# Patient Record
Sex: Male | Born: 1960 | Race: White | Hispanic: No | Marital: Married | Smoking: Former smoker
Health system: Southern US, Community
[De-identification: ages and names within clinical notes are randomized; demographics above are authoritative.]

## PROBLEM LIST (undated history)

## (undated) DIAGNOSIS — I10 Essential (primary) hypertension: Secondary | ICD-10-CM

## (undated) DIAGNOSIS — F329 Major depressive disorder, single episode, unspecified: Secondary | ICD-10-CM

## (undated) DIAGNOSIS — I714 Abdominal aortic aneurysm, without rupture, unspecified: Secondary | ICD-10-CM

## (undated) DIAGNOSIS — F4310 Post-traumatic stress disorder, unspecified: Secondary | ICD-10-CM

## (undated) DIAGNOSIS — M1990 Unspecified osteoarthritis, unspecified site: Secondary | ICD-10-CM

## (undated) DIAGNOSIS — F419 Anxiety disorder, unspecified: Secondary | ICD-10-CM

## (undated) DIAGNOSIS — I2510 Atherosclerotic heart disease of native coronary artery without angina pectoris: Secondary | ICD-10-CM

## (undated) DIAGNOSIS — F32A Depression, unspecified: Secondary | ICD-10-CM

## (undated) HISTORY — PX: APPENDECTOMY: SHX54

## (undated) HISTORY — PX: HERNIA REPAIR: SHX51

---

## 2008-10-19 HISTORY — PX: WISDOM TOOTH EXTRACTION: SHX21

## 2013-05-16 ENCOUNTER — Emergency Department (HOSPITAL_COMMUNITY): Payer: Worker's Compensation

## 2013-05-16 ENCOUNTER — Emergency Department (HOSPITAL_COMMUNITY)
Admission: EM | Admit: 2013-05-16 | Discharge: 2013-05-17 | Disposition: A | Discharge status: Home / Self Care | Payer: Worker's Compensation | Attending: Emergency Medicine | Admitting: Emergency Medicine

## 2013-05-16 ENCOUNTER — Encounter (HOSPITAL_COMMUNITY): Payer: Self-pay | Admitting: Unknown Physician Specialty

## 2013-05-16 DIAGNOSIS — S42002A Fracture of unspecified part of left clavicle, initial encounter for closed fracture: Secondary | ICD-10-CM

## 2013-05-16 DIAGNOSIS — IMO0002 Reserved for concepts with insufficient information to code with codable children: Secondary | ICD-10-CM | POA: Insufficient documentation

## 2013-05-16 DIAGNOSIS — S42009A Fracture of unspecified part of unspecified clavicle, initial encounter for closed fracture: Secondary | ICD-10-CM | POA: Insufficient documentation

## 2013-05-16 DIAGNOSIS — S46909A Unspecified injury of unspecified muscle, fascia and tendon at shoulder and upper arm level, unspecified arm, initial encounter: Secondary | ICD-10-CM | POA: Insufficient documentation

## 2013-05-16 DIAGNOSIS — S069X9A Unspecified intracranial injury with loss of consciousness of unspecified duration, initial encounter: Secondary | ICD-10-CM

## 2013-05-16 DIAGNOSIS — S060X9A Concussion with loss of consciousness of unspecified duration, initial encounter: Secondary | ICD-10-CM | POA: Insufficient documentation

## 2013-05-16 DIAGNOSIS — Y9389 Activity, other specified: Secondary | ICD-10-CM | POA: Insufficient documentation

## 2013-05-16 DIAGNOSIS — T07XXXA Unspecified multiple injuries, initial encounter: Secondary | ICD-10-CM

## 2013-05-16 DIAGNOSIS — S79919A Unspecified injury of unspecified hip, initial encounter: Secondary | ICD-10-CM | POA: Insufficient documentation

## 2013-05-16 DIAGNOSIS — Z79899 Other long term (current) drug therapy: Secondary | ICD-10-CM | POA: Insufficient documentation

## 2013-05-16 DIAGNOSIS — I10 Essential (primary) hypertension: Secondary | ICD-10-CM | POA: Insufficient documentation

## 2013-05-16 DIAGNOSIS — Y92410 Unspecified street and highway as the place of occurrence of the external cause: Secondary | ICD-10-CM | POA: Insufficient documentation

## 2013-05-16 DIAGNOSIS — Z8739 Personal history of other diseases of the musculoskeletal system and connective tissue: Secondary | ICD-10-CM | POA: Insufficient documentation

## 2013-05-16 DIAGNOSIS — M25552 Pain in left hip: Secondary | ICD-10-CM

## 2013-05-16 DIAGNOSIS — S4980XA Other specified injuries of shoulder and upper arm, unspecified arm, initial encounter: Secondary | ICD-10-CM | POA: Insufficient documentation

## 2013-05-16 DIAGNOSIS — F17200 Nicotine dependence, unspecified, uncomplicated: Secondary | ICD-10-CM | POA: Insufficient documentation

## 2013-05-16 HISTORY — DX: Essential (primary) hypertension: I10

## 2013-05-16 HISTORY — DX: Unspecified osteoarthritis, unspecified site: M19.90

## 2013-05-16 LAB — BASIC METABOLIC PANEL
BUN: 20 mg/dL (ref 6–23)
Chloride: 101 mEq/L (ref 96–112)
Glucose, Bld: 120 mg/dL — ABNORMAL HIGH (ref 70–99)
Potassium: 4.7 mEq/L (ref 3.5–5.1)

## 2013-05-16 LAB — CBC
HCT: 44.3 % (ref 39.0–52.0)
Hemoglobin: 15.2 g/dL (ref 13.0–17.0)
MCV: 92.7 fL (ref 78.0–100.0)
WBC: 15.6 10*3/uL — ABNORMAL HIGH (ref 4.0–10.5)

## 2013-05-16 LAB — ETHANOL: Alcohol, Ethyl (B): <11 mg/dL (ref 0–11)

## 2013-05-16 LAB — URINALYSIS, ROUTINE W REFLEX MICROSCOPIC
Bilirubin Urine: NEGATIVE
Ketones, ur: NEGATIVE mg/dL
Nitrite: NEGATIVE
Protein, ur: NEGATIVE mg/dL
Urobilinogen, UA: 0.2 mg/dL (ref 0.0–1.0)

## 2013-05-16 MED ORDER — TETANUS-DIPHTH-ACELL PERTUSSIS 5-2.5-18.5 LF-MCG/0.5 IM SUSP
0.5000 mL | Freq: Once | INTRAMUSCULAR | Status: AC
  Administered 2013-05-17: 0.5 mL via INTRAMUSCULAR
  Filled 2013-05-16: qty 0.5

## 2013-05-16 MED ORDER — SODIUM CHLORIDE 0.9 % IV BOLUS (SEPSIS)
1000.0000 mL | Freq: Once | INTRAVENOUS | Status: AC
  Administered 2013-05-17: 1000 mL via INTRAVENOUS

## 2013-05-16 MED ORDER — SODIUM CHLORIDE 0.9 % IV BOLUS (SEPSIS)
1000.0000 mL | Freq: Once | INTRAVENOUS | Status: AC
  Administered 2013-05-16: 1000 mL via INTRAVENOUS

## 2013-05-16 MED ORDER — FENTANYL CITRATE 0.05 MG/ML IJ SOLN
100.0000 ug | Freq: Once | INTRAMUSCULAR | Status: AC
  Administered 2013-05-16: 100 ug via INTRAVENOUS
  Filled 2013-05-16: qty 2

## 2013-05-16 MED ORDER — ONDANSETRON HCL 4 MG/2ML IJ SOLN
4.0000 mg | Freq: Once | INTRAMUSCULAR | Status: AC
  Administered 2013-05-16: 4 mg via INTRAVENOUS
  Filled 2013-05-16: qty 2
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
## 2013-05-16 NOTE — ED Notes (Signed)
Attempted to call patient wife without success. [phone] Iris Pert.

## 2013-05-17 ENCOUNTER — Emergency Department (HOSPITAL_COMMUNITY): Payer: Worker's Compensation

## 2013-05-17 LAB — RAPID URINE DRUG SCREEN, HOSP PERFORMED
Amphetamines: NOT DETECTED
Barbiturates: NOT DETECTED
Benzodiazepines: NOT DETECTED
Cocaine: NOT DETECTED
Opiates: NOT DETECTED
Tetrahydrocannabinol: NOT DETECTED

## 2013-05-17 MED ORDER — ONDANSETRON HCL 4 MG PO TABS: 4.0000 mg | ORAL_TABLET | Freq: Four times a day (QID) | ORAL | Status: DC

## 2013-05-17 MED ORDER — OXYCODONE-ACETAMINOPHEN 5-325 MG PO TABS: 1.0000 | ORAL_TABLET | Freq: Four times a day (QID) | ORAL | Status: DC | PRN

## 2013-05-17 NOTE — ED Notes (Addendum)
Dr. Patria Mane back  In to room to see pt.

## 2013-05-17 NOTE — ED Notes (Signed)
Pt to xray, no changes, alert, NAD, calm, intereactive
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
## 2013-05-17 NOTE — ED Notes (Signed)
Pt able to stand and pivot into w/c with supervision.

## 2013-05-17 NOTE — ED Notes (Addendum)
Pt reports tolerating standing and ambulating in xray. Dr. Patria Mane in to speak with pt & family, pt and family OK with d/c plan.

## 2013-05-17 NOTE — ED Provider Notes (Signed)
2:00 AM I was called to the patient's room as he was trying to ambulate at time of discharge the patient seemed to have some possible weakness in his right leg because in the fall.  The nurse was able to take him to the ground he had no significant collar trauma in the emergency department.  He has normal strength in flexion and extension at the knee as well as flexion and extension at the right hip.  He has normal pulses in his right foot.  He has normal sensation in his right lower extremity.  He has no tenderness of his thoracic or lumbar spine.  There is noted to be some swelling of the medial aspect of his distal right thigh with some focal tenderness in this area.  This is all likely muscular in nature and contusion.  The patient was able to bear weight on his right leg although he seemed to have difficulty walking on his right leg and he stated he relates having difficulty picking up his right foot.  No obvious foot drop is noted   No indication for CT imaging.  No indication for lumbar or thoracic films.  Patient and her low back pain.  I suspect he will do just fine.  Plain films of his right knee are pending  Lyanne Co, MD 05/17/13 [phone]
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
## 2016-01-29 ENCOUNTER — Ambulatory Visit: Payer: Self-pay | Admitting: Orthopedic Surgery

## 2016-02-10 ENCOUNTER — Ambulatory Visit: Payer: Self-pay | Admitting: Orthopedic Surgery

## 2016-02-10 NOTE — H&P (Signed)
TOTAL HIP ADMISSION H&P  Patient is admitted for left total hip arthroplasty.  Subjective:  Chief Complaint: left hip pain  HPI: Phillip CastleDavid Thomas, 55 y.o. male, has a history of pain and functional disability in the left hip(s) due to arthritis and patient has failed non-surgical conservative treatments for greater than 12 weeks to include NSAID's and/or analgesics, flexibility and strengthening excercises, use of assistive devices, weight reduction as appropriate and activity modification.  Onset of symptoms was gradual starting 4 years ago with gradually worsening course since that time.The patient noted no past surgery on the left hip(s).  Patient currently rates pain in the left hip at 10 out of 10 with activity. Patient has night pain, worsening of pain with activity and weight bearing, pain that interfers with activities of daily living and pain with passive range of motion. Patient has evidence of subchondral cysts, subchondral sclerosis, periarticular osteophytes and joint space narrowing by imaging studies. This condition presents safety issues increasing the risk of falls. There is no current active infection.  There are no active problems to display for this patient.  Past Medical History  Diagnosis Date  . Hypertension   . Arthritis     Past Surgical History  Procedure Laterality Date  . Hernia repair    . Appendectomy       (Not in a hospital admission) No Known Allergies  Social History  Substance Use Topics  . Smoking status: Current Every Day Smoker  . Smokeless tobacco: Not on file  . Alcohol Use: 2.4 oz/week    4 Cans of beer per week     Comment: 4 cans a day, 3x a week.    No family history on file.   Review of Systems  Constitutional: Negative.   HENT: Negative.   Eyes: Negative.   Respiratory: Negative.   Cardiovascular: Negative.   Gastrointestinal: Negative.   Genitourinary: Negative.   Musculoskeletal: Positive for joint pain.  Skin: Negative.    Neurological: Negative.   Endo/Heme/Allergies: Negative.   Psychiatric/Behavioral: Positive for depression and memory loss. The patient is nervous/anxious and has insomnia.     Objective:  Physical Exam  Vitals reviewed. Constitutional: He is oriented to person, place, and time. He appears well-developed and well-nourished.  HENT:  Head: Normocephalic and atraumatic.  Eyes: Conjunctivae and EOM are normal. Pupils are equal, round, and reactive to light.  Neck: Normal range of motion.  Cardiovascular: Normal rate and regular rhythm.   Respiratory: Effort normal and breath sounds normal. No respiratory distress.  GI: Soft. Bowel sounds are normal. He exhibits no distension.  Genitourinary:  deferred  Musculoskeletal:       Left hip: He exhibits decreased range of motion and bony tenderness.  Neurological: He is alert and oriented to person, place, and time. He has normal reflexes.  Skin: Skin is warm and dry.  Psychiatric: He has a normal mood and affect. His behavior is normal. Judgment and thought content normal.    Vital signs in last 24 hours: @VSRANGES @  Labs:   Estimated body mass index is 31.25 kg/(m^2) as calculated from the following:   Height as of 05/16/13: 6\' 3"  (1.905 m).   Weight as of 05/16/13: 113.399 kg (250 lb).   Imaging Review Plain radiographs demonstrate severe degenerative joint disease of the left hip(s). The bone quality appears to be adequate for age and reported activity level.  Assessment/Plan:  End stage arthritis, left hip(s)  The patient history, physical examination, clinical judgement of the provider  and imaging studies are consistent with end stage degenerative joint disease of the left hip(s) and total hip arthroplasty is deemed medically necessary. The treatment options including medical management, injection therapy, arthroscopy and arthroplasty were discussed at length. The risks and benefits of total hip arthroplasty were presented and  reviewed. The risks due to aseptic loosening, infection, stiffness, dislocation/subluxation,  thromboembolic complications and other imponderables were discussed.  The patient acknowledged the explanation, agreed to proceed with the plan and consent was signed. Patient is being admitted for inpatient treatment for surgery, pain control, PT, OT, prophylactic antibiotics, VTE prophylaxis, progressive ambulation and ADL's and discharge planning.The patient is planning to be discharged home with home health services

## 2016-02-17 ENCOUNTER — Other Ambulatory Visit (HOSPITAL_COMMUNITY): Payer: Self-pay | Admitting: *Deleted

## 2016-02-17 NOTE — Patient Instructions (Signed)
Phillip CastleDavid Thomas  02/17/2016   Your procedure is scheduled on: 02-27-16  [facility]  [facility] 3rd floor to  Short Stay Center at 1045 AM.  Call this number if you have problems the morning of surgery [phone]   Remember: ONLY 1 PERSON MAY GO WITH YOU TO SHORT STAY TO GET  READY MORNING OF YOUR SURGERY.  Do not eat food or drink liquids :After Midnight.     Take these medicines the morning of surgery with A SIP OF WATER: none                               You may not have any metal on your body including hair pins and              piercings  Do not wear jewelry, make-up, lotions, powders or perfumes, deodorant             Do not wear nail polish.  Do not shave  48 hours prior to surgery.              Men may shave face and neck.   Do not bring valuables to the hospital. [facility] IS NOT             RESPONSIBLE   FOR VALUABLES.  Contacts, dentures or bridgework may not be worn into surgery.  Leave suitcase in the car. After surgery it may be brought to your room.     Patients discharged the day of surgery will not be allowed to drive home.  Name and phone number of your driver:  Special Instructions: N/A              Please read over the following fact sheets you were given: _____________________________________________________________________             [facility] - Preparing [facility] Before surgery, you can play an important role.  Because skin is not sterile, your skin needs to be as free of germs as possible.  You can reduce the number of germs on your skin by washing with CHG (chlorahexidine gluconate) soap before surgery.  CHG is an antiseptic cleaner which kills germs and bonds with the skin to continue killing germs even after washing. Please DO NOT use if you have an allergy to CHG or antibacterial soaps.  If your skin becomes reddened/irritated stop using the CHG and inform your nurse when you arrive at  Short Stay. Do not shave (including legs and underarms) for at least 48 hours prior to the first CHG shower.  You may shave your face/neck. Please follow these instructions carefully:  1.  Shower with CHG Soap the night before surgery and the  morning of Surgery.  2.  If you choose to wash your hair, wash your hair first as usual with your  normal  shampoo.  3.  After you shampoo, rinse your hair and body thoroughly to remove the  shampoo.                           4.  Use CHG as you would any other liquid soap.  You can apply chg directly  to the skin and wash  Gently with a scrungie or clean washcloth.  5.  Apply the CHG Soap to your body ONLY FROM THE NECK DOWN.   Do not use on face/ open                           Wound or open sores. Avoid contact with eyes, ears mouth and genitals (private parts).                       Wash face,  Genitals (private parts) with your normal soap.             6.  Wash thoroughly, paying special attention to the area where your surgery  will be performed.  7.  Thoroughly rinse your body with warm water from the neck down.  8.  DO NOT shower/wash with your normal soap after using and rinsing off  the CHG Soap.                9.  Pat yourself dry with a clean towel.            10.  Wear clean pajamas.            11.  Place clean sheets on your bed the night of your first shower and do not  sleep with pets. Day of Surgery : Do not apply any lotions/deodorants the morning of surgery.  Please wear clean clothes to the hospital/surgery center.  FAILURE TO FOLLOW THESE INSTRUCTIONS MAY RESULT IN THE CANCELLATION OF YOUR SURGERY PATIENT SIGNATURE_________________________________  NURSE SIGNATURE__________________________________  ________________________________________________________________________   Phillip Thomas  An incentive spirometer is a tool that can help keep your lungs clear and active. This tool measures how well you are  filling your lungs with each breath. Taking long deep breaths may help reverse or decrease the chance of developing breathing (pulmonary) problems (especially infection) following:  A long period of time when you are unable to move or be active. BEFORE THE PROCEDURE   If the spirometer includes an indicator to show your best effort, your nurse or respiratory therapist will set it to a desired goal.  If possible, sit up straight or lean slightly forward. Try not to slouch.  Hold the incentive spirometer in an upright position. INSTRUCTIONS FOR USE   Sit on the edge of your bed if possible, or sit up as far as you can in bed or on a chair.  Hold the incentive spirometer in an upright position.  Breathe out normally.  Place the mouthpiece in your mouth and seal your lips tightly around it.  Breathe in slowly and as deeply as possible, raising the piston or the ball toward the top of the column.  Hold your breath for 3-5 seconds or for as long as possible. Allow the piston or ball to fall to the bottom of the column.  Remove the mouthpiece from your mouth and breathe out normally.  Rest for a few seconds and repeat Steps 1 through 7 at least 10 times every 1-2 hours when you are awake. Take your time and take a few normal breaths between deep breaths.  The spirometer may include an indicator to show your best effort. Use the indicator as a goal to work toward during each repetition.  After each set of 10 deep breaths, practice coughing to be sure your lungs are clear. If you have an incision (the cut made at the time of surgery),  support your incision when coughing by placing a pillow or rolled up towels firmly against it. Once you are able to get out of bed, walk around indoors and cough well. You may stop using the incentive spirometer when instructed by your caregiver.  RISKS AND COMPLICATIONS  Take your time so you do not get dizzy or light-headed.  If you are in pain, you may  need to take or ask for pain medication before doing incentive spirometry. It is harder to take a deep breath if you are having pain. AFTER USE  Rest and breathe slowly and easily.  It can be helpful to keep track of a log of your progress. Your caregiver can provide you with a simple table to help with this. If you are using the spirometer at home, follow these instructions: [place] IF:   You are having difficultly using the spirometer.  You have trouble using the spirometer as often as instructed.  Your pain medication is not giving enough relief while using the spirometer.  You develop fever of 100.5 F (38.1 C) or higher. SEEK IMMEDIATE MEDICAL CARE IF:   You cough up bloody sputum that had not been present before.  You develop fever of 102 F (38.9 C) or greater.  You develop worsening pain at or near the incision site. MAKE SURE YOU:   Understand these instructions.  Will watch your condition.  Will get help right away if you are not doing well or get worse. Document Released: 02/15/2007 Document Revised: 12/28/2011 Document Reviewed: 04/18/2007 ExitCare Patient Information 2014 Exit[employer], [place].   ________________________________________________________________________  WHAT IS A BLOOD TRANSFUSION? Blood Transfusion Information  A transfusion is the replacement of blood or some of its parts. Blood is made up of multiple cells which provide different functions.  Red blood cells carry oxygen and are used for blood loss replacement.  White blood cells fight against infection.  Platelets control bleeding.  Plasma helps clot blood.  Other blood products are available for specialized needs, such as hemophilia or other clotting disorders. BEFORE THE TRANSFUSION  Who gives blood for transfusions?   Healthy volunteers who are fully evaluated to make sure their blood is safe. This is blood bank blood. Transfusion therapy is the safest it has ever been in  the practice of medicine. Before blood is taken from a donor, a complete history is taken to make sure that person has no history of diseases nor engages in risky social behavior (examples are intravenous drug use or sexual activity with multiple partners). The donor's travel history is screened to minimize risk of transmitting infections, such as malaria. The donated blood is tested for signs of infectious diseases, such as HIV and hepatitis. The blood is then tested to be sure it is compatible with you in order to minimize the chance of a transfusion reaction. If you or a relative donates blood, this is often done in anticipation of surgery and is not appropriate for emergency situations. It takes many days to process the donated blood. RISKS AND COMPLICATIONS Although transfusion therapy is very safe and saves many lives, the main dangers of transfusion include:   Getting an infectious disease.  Developing a transfusion reaction. This is an allergic reaction to something in the blood you were given. Every precaution is taken to prevent this. The decision to have a blood transfusion has been considered carefully by your caregiver before blood is given. Blood is not given unless the benefits outweigh the risks. AFTER THE TRANSFUSION  Right after receiving a blood transfusion, you will usually feel much better and more energetic. This is especially true if your red blood cells have gotten low (anemic). The transfusion raises the level of the red blood cells which carry oxygen, and this usually causes an energy increase.  The nurse administering the transfusion will monitor you carefully for complications. HOME CARE INSTRUCTIONS  No special instructions are needed after a transfusion. You may find your energy is better. Speak with your caregiver about any limitations on activity for underlying diseases you may have. SEEK MEDICAL CARE IF:   Your condition is not improving after your  transfusion.  You develop redness or irritation at the intravenous (IV) site. SEEK IMMEDIATE MEDICAL CARE IF:  Any of the following symptoms occur over the next 12 hours:  Shaking chills.  You have a temperature by mouth above 102 F (38.9 C), not controlled by medicine.  Chest, back, or muscle pain.  People around you feel you are not acting correctly or are confused.  Shortness of breath or difficulty breathing.  Dizziness and fainting.  You get a rash or develop hives.  You have a decrease in urine output.  Your urine turns a dark color or changes to pink, red, or brown. Any of the following symptoms occur over the next 10 days:  You have a temperature by mouth above 102 F (38.9 C), not controlled by medicine.  Shortness of breath.  Weakness after normal activity.  The white part of the eye turns yellow (jaundice).  You have a decrease in the amount of urine or are urinating less often.  Your urine turns a dark color or changes to pink, red, or brown. Document Released: 10/02/2000 Document Revised: 12/28/2011 Document Reviewed: 05/21/2008 [facility] Patient Information 2014 [place], [place].  _______________________________________________________________________

## 2016-02-18 ENCOUNTER — Encounter (HOSPITAL_COMMUNITY)
Admission: RE | Admit: 2016-02-18 | Discharge: 2016-02-18 | Disposition: A | Discharge status: Home / Self Care | Payer: Self-pay | Source: Ambulatory Visit | Attending: Orthopedic Surgery | Admitting: Orthopedic Surgery

## 2016-02-18 ENCOUNTER — Encounter (HOSPITAL_COMMUNITY): Payer: Self-pay

## 2016-02-18 DIAGNOSIS — M1612 Unilateral primary osteoarthritis, left hip: Secondary | ICD-10-CM | POA: Insufficient documentation

## 2016-02-18 DIAGNOSIS — Z01812 Encounter for preprocedural laboratory examination: Secondary | ICD-10-CM | POA: Insufficient documentation

## 2016-02-18 HISTORY — DX: Anxiety disorder, unspecified: F41.9

## 2016-02-18 HISTORY — DX: Depression, unspecified: F32.A

## 2016-02-18 HISTORY — DX: Major depressive disorder, single episode, unspecified: F32.9

## 2016-02-18 LAB — CBC
HEMATOCRIT: 40.8 % (ref 39.0–52.0)
Hemoglobin: 14.1 g/dL (ref 13.0–17.0)
MCH: 30.6 pg (ref 26.0–34.0)
MCHC: 34.6 g/dL (ref 30.0–36.0)
MCV: 88.5 fL (ref 78.0–100.0)
PLATELETS: 223 10*3/uL (ref 150–400)
RBC: 4.61 MIL/uL (ref 4.22–5.81)
RDW: 13.8 % (ref 11.5–15.5)
WBC: 6.6 10*3/uL (ref 4.0–10.5)

## 2016-02-18 LAB — BASIC METABOLIC PANEL
Anion gap: 9 (ref 5–15)
BUN: 13 mg/dL (ref 6–20)
CO2: 28 mmol/L (ref 22–32)
Calcium: 9.3 mg/dL (ref 8.9–10.3)
Chloride: 103 mmol/L (ref 101–111)
Creatinine, Ser: 0.74 mg/dL (ref 0.61–1.24)
GFR calc Af Amer: >60 mL/min (ref >60)
GLUCOSE: 99 mg/dL (ref 65–99)
POTASSIUM: 4.4 mmol/L (ref 3.5–5.1)
Sodium: 140 mmol/L (ref 135–145)

## 2016-02-18 LAB — ABO/RH: ABO/RH(D): A NEG

## 2016-02-18 LAB — SURGICAL PCR SCREEN
MRSA, PCR: NEGATIVE
Staphylococcus aureus: NEGATIVE

## 2016-02-18 NOTE — Progress Notes (Addendum)
[id number] EKG [facility] ON CHART VASCULAR  CLEARANCE DR Pura SpiceUGE AND KINSLO COLEY PAC ON CHART AND LOV VASCULAR 01-17-16 ON CHART DENTAL CLEARANCE DR PITTS DDS ON CHART FOR 02-27-16 SURGERY

## 2016-02-26 MED ORDER — CEFAZOLIN SODIUM 10 G IJ SOLR
3.0000 g | INTRAMUSCULAR | Status: AC
  Administered 2016-02-27: 3 g via INTRAVENOUS
  Filled 2016-02-26: qty 3

## 2016-02-27 ENCOUNTER — Encounter (HOSPITAL_COMMUNITY)
Admission: RE | Disposition: A | Discharge status: Home Health | Payer: Self-pay | Source: Ambulatory Visit | Attending: Orthopedic Surgery

## 2016-02-27 ENCOUNTER — Inpatient Hospital Stay (HOSPITAL_COMMUNITY): Payer: Self-pay

## 2016-02-27 ENCOUNTER — Inpatient Hospital Stay (HOSPITAL_COMMUNITY)
Admission: RE | Admit: 2016-02-27 | Discharge: 2016-02-28 | DRG: 470 | Disposition: A | Discharge status: Home Health | Payer: Self-pay | Source: Ambulatory Visit | Attending: Orthopedic Surgery | Admitting: Orthopedic Surgery

## 2016-02-27 ENCOUNTER — Inpatient Hospital Stay (HOSPITAL_COMMUNITY): Payer: Self-pay | Admitting: Anesthesiology

## 2016-02-27 ENCOUNTER — Encounter (HOSPITAL_COMMUNITY): Payer: Self-pay | Admitting: *Deleted

## 2016-02-27 DIAGNOSIS — F17210 Nicotine dependence, cigarettes, uncomplicated: Secondary | ICD-10-CM | POA: Diagnosis present

## 2016-02-27 DIAGNOSIS — M1612 Unilateral primary osteoarthritis, left hip: Principal | ICD-10-CM | POA: Diagnosis present

## 2016-02-27 DIAGNOSIS — Z09 Encounter for follow-up examination after completed treatment for conditions other than malignant neoplasm: Secondary | ICD-10-CM

## 2016-02-27 DIAGNOSIS — M25552 Pain in left hip: Secondary | ICD-10-CM

## 2016-02-27 HISTORY — PX: TOTAL HIP ARTHROPLASTY: SHX124

## 2016-02-27 HISTORY — DX: Post-traumatic stress disorder, unspecified: F43.10

## 2016-02-27 HISTORY — DX: Abdominal aortic aneurysm, without rupture, unspecified: I71.40

## 2016-02-27 HISTORY — DX: Abdominal aortic aneurysm, without rupture: I71.4

## 2016-02-27 LAB — TYPE AND SCREEN
ABO/RH(D): A NEG
ANTIBODY SCREEN: NEGATIVE

## 2016-02-27 SURGERY — ARTHROPLASTY, HIP, TOTAL, ANTERIOR APPROACH
Anesthesia: General | Site: Hip | Laterality: Left

## 2016-02-27 MED ORDER — ISOPROPYL ALCOHOL 70 % SOLN
Status: DC | PRN
  Administered 2016-02-27: 1 via TOPICAL

## 2016-02-27 MED ORDER — ACETAMINOPHEN 10 MG/ML IV SOLN
1000.0000 mg | INTRAVENOUS | Status: AC
  Administered 2016-02-27: 1000 mg via INTRAVENOUS

## 2016-02-27 MED ORDER — HYDROCODONE-ACETAMINOPHEN 5-325 MG PO TABS
1.0000 | ORAL_TABLET | ORAL | Status: DC | PRN
Start: 2016-02-27 — End: 2016-02-28
  Administered 2016-02-27 – 2016-02-28 (×5): 2 via ORAL
  Filled 2016-02-27 (×5): qty 2

## 2016-02-27 MED ORDER — MIDAZOLAM HCL 2 MG/2ML IJ SOLN
INTRAMUSCULAR | Status: DC | PRN
  Administered 2016-02-27: 2 mg via INTRAVENOUS

## 2016-02-27 MED ORDER — DEXAMETHASONE SODIUM PHOSPHATE 10 MG/ML IJ SOLN
INTRAMUSCULAR | Status: AC
  Filled 2016-02-27: qty 1

## 2016-02-27 MED ORDER — PROPOFOL 10 MG/ML IV BOLUS
INTRAVENOUS | Status: DC | PRN
  Administered 2016-02-27: 200 mg via INTRAVENOUS

## 2016-02-27 MED ORDER — PROPOFOL 10 MG/ML IV BOLUS
INTRAVENOUS | Status: AC
Start: 2016-02-27 — End: 2016-02-27
  Filled 2016-02-27: qty 20

## 2016-02-27 MED ORDER — ONDANSETRON HCL 4 MG/2ML IJ SOLN
INTRAMUSCULAR | Status: AC
  Filled 2016-02-27: qty 2

## 2016-02-27 MED ORDER — ACETAMINOPHEN 650 MG RE SUPP: 650.0000 mg | Freq: Four times a day (QID) | RECTAL | Status: DC | PRN

## 2016-02-27 MED ORDER — HYDROGEN PEROXIDE 3 % EX SOLN
CUTANEOUS | Status: DC | PRN
  Administered 2016-02-27: 1

## 2016-02-27 MED ORDER — SUCCINYLCHOLINE CHLORIDE 20 MG/ML IJ SOLN
INTRAMUSCULAR | Status: DC | PRN
  Administered 2016-02-27: 100 mg via INTRAVENOUS

## 2016-02-27 MED ORDER — WATER FOR IRRIGATION, STERILE IR SOLN
Status: DC | PRN
  Administered 2016-02-27: 1000 mL

## 2016-02-27 MED ORDER — METOCLOPRAMIDE HCL 5 MG/ML IJ SOLN: 5.0000 mg | Freq: Three times a day (TID) | INTRAMUSCULAR | Status: DC | PRN

## 2016-02-27 MED ORDER — KETOROLAC TROMETHAMINE 15 MG/ML IJ SOLN
15.0000 mg | Freq: Four times a day (QID) | INTRAMUSCULAR | Status: DC
  Administered 2016-02-27 – 2016-02-28 (×3): 15 mg via INTRAVENOUS
  Filled 2016-02-27 (×3): qty 1

## 2016-02-27 MED ORDER — SORBITOL 70 % SOLN
30.0000 mL | Freq: Every day | Status: DC | PRN
  Filled 2016-02-27: qty 30

## 2016-02-27 MED ORDER — ROCURONIUM BROMIDE 100 MG/10ML IV SOLN
INTRAVENOUS | Status: DC | PRN
  Administered 2016-02-27: 10 mg via INTRAVENOUS
  Administered 2016-02-27 (×2): 20 mg via INTRAVENOUS
  Administered 2016-02-27: 50 mg via INTRAVENOUS

## 2016-02-27 MED ORDER — KETOROLAC TROMETHAMINE 30 MG/ML IJ SOLN
INTRAMUSCULAR | Status: AC
  Filled 2016-02-27: qty 1

## 2016-02-27 MED ORDER — POVIDONE-IODINE 10 % EX SWAB: 2.0000 "application " | Freq: Once | CUTANEOUS | Status: DC

## 2016-02-27 MED ORDER — SODIUM CHLORIDE 0.9 % IJ SOLN
INTRAMUSCULAR | Status: DC | PRN
  Administered 2016-02-27: 30 mL

## 2016-02-27 MED ORDER — HYDROMORPHONE HCL 1 MG/ML IJ SOLN
0.5000 mg | INTRAMUSCULAR | Status: DC | PRN
  Administered 2016-02-27: 0.5 mg via INTRAVENOUS
  Filled 2016-02-27: qty 1

## 2016-02-27 MED ORDER — TRANEXAMIC ACID 1000 MG/10ML IV SOLN
1000.0000 mg | Freq: Once | INTRAVENOUS | Status: AC
  Administered 2016-02-27: 1000 mg via INTRAVENOUS
  Filled 2016-02-27: qty 10

## 2016-02-27 MED ORDER — ONDANSETRON HCL 4 MG PO TABS
4.0000 mg | ORAL_TABLET | Freq: Four times a day (QID) | ORAL | Status: DC | PRN
Start: 2016-02-27 — End: 2016-02-28

## 2016-02-27 MED ORDER — PHENOL 1.4 % MT LIQD: 1.0000 | OROMUCOSAL | Status: DC | PRN

## 2016-02-27 MED ORDER — ONDANSETRON HCL 4 MG/2ML IJ SOLN
INTRAMUSCULAR | Status: DC | PRN
Start: 2016-02-27 — End: 2016-02-27
  Administered 2016-02-27: 4 mg via INTRAVENOUS

## 2016-02-27 MED ORDER — ACETAMINOPHEN 10 MG/ML IV SOLN
INTRAVENOUS | Status: AC
  Filled 2016-02-27: qty 100

## 2016-02-27 MED ORDER — FENTANYL CITRATE (PF) 100 MCG/2ML IJ SOLN
INTRAMUSCULAR | Status: AC
  Filled 2016-02-27: qty 2

## 2016-02-27 MED ORDER — BUPIVACAINE-EPINEPHRINE (PF) 0.25% -1:200000 IJ SOLN
INTRAMUSCULAR | Status: AC
  Filled 2016-02-27: qty 30

## 2016-02-27 MED ORDER — SUGAMMADEX SODIUM 500 MG/5ML IV SOLN
INTRAVENOUS | Status: AC
  Filled 2016-02-27: qty 5

## 2016-02-27 MED ORDER — LIDOCAINE HCL (CARDIAC) 20 MG/ML IV SOLN
INTRAVENOUS | Status: DC | PRN
  Administered 2016-02-27: 100 mg via INTRAVENOUS

## 2016-02-27 MED ORDER — DEXAMETHASONE SODIUM PHOSPHATE 10 MG/ML IJ SOLN
10.0000 mg | Freq: Once | INTRAMUSCULAR | Status: AC
  Administered 2016-02-28: 10 mg via INTRAVENOUS
  Filled 2016-02-27: qty 1

## 2016-02-27 MED ORDER — FENTANYL CITRATE (PF) 250 MCG/5ML IJ SOLN
INTRAMUSCULAR | Status: AC
  Filled 2016-02-27: qty 5

## 2016-02-27 MED ORDER — METHOCARBAMOL 1000 MG/10ML IJ SOLN
500.0000 mg | Freq: Four times a day (QID) | INTRAVENOUS | Status: DC | PRN
  Administered 2016-02-27: 500 mg via INTRAVENOUS
  Filled 2016-02-27 (×2): qty 5

## 2016-02-27 MED ORDER — HYDROMORPHONE HCL 2 MG/ML IJ SOLN
INTRAMUSCULAR | Status: AC
  Filled 2016-02-27: qty 1

## 2016-02-27 MED ORDER — HYDROMORPHONE HCL 1 MG/ML IJ SOLN
INTRAMUSCULAR | Status: DC | PRN
  Administered 2016-02-27 (×2): 0.5 mg via INTRAVENOUS

## 2016-02-27 MED ORDER — SODIUM CHLORIDE 0.9 % IV SOLN
INTRAVENOUS | Status: DC
  Administered 2016-02-27: 17:00:00 via INTRAVENOUS

## 2016-02-27 MED ORDER — ONDANSETRON HCL 4 MG/2ML IJ SOLN
4.0000 mg | Freq: Four times a day (QID) | INTRAMUSCULAR | Status: DC | PRN
Start: 2016-02-27 — End: 2016-02-28

## 2016-02-27 MED ORDER — ROCURONIUM BROMIDE 50 MG/5ML IV SOLN
INTRAVENOUS | Status: AC
  Filled 2016-02-27: qty 2

## 2016-02-27 MED ORDER — BUPIVACAINE-EPINEPHRINE 0.25% -1:200000 IJ SOLN
INTRAMUSCULAR | Status: DC | PRN
  Administered 2016-02-27: 30 mL

## 2016-02-27 MED ORDER — FENTANYL CITRATE (PF) 100 MCG/2ML IJ SOLN
INTRAMUSCULAR | Status: DC | PRN
  Administered 2016-02-27 (×6): 100 ug via INTRAVENOUS
  Administered 2016-02-27: 150 ug via INTRAVENOUS

## 2016-02-27 MED ORDER — LACTATED RINGERS IV SOLN
INTRAVENOUS | Status: DC | PRN
  Administered 2016-02-27 (×3): via INTRAVENOUS

## 2016-02-27 MED ORDER — SUGAMMADEX SODIUM 200 MG/2ML IV SOLN
INTRAVENOUS | Status: DC | PRN
  Administered 2016-02-27: 200 mg via INTRAVENOUS

## 2016-02-27 MED ORDER — CEFAZOLIN SODIUM-DEXTROSE 2-4 GM/100ML-% IV SOLN
2.0000 g | Freq: Four times a day (QID) | INTRAVENOUS | Status: AC
  Administered 2016-02-27 (×2): 2 g via INTRAVENOUS
  Filled 2016-02-27 (×2): qty 100

## 2016-02-27 MED ORDER — METHOCARBAMOL 500 MG PO TABS
500.0000 mg | ORAL_TABLET | Freq: Four times a day (QID) | ORAL | Status: DC | PRN
  Administered 2016-02-27: 500 mg via ORAL
  Filled 2016-02-27: qty 1

## 2016-02-27 MED ORDER — KETOROLAC TROMETHAMINE 30 MG/ML IJ SOLN
INTRAMUSCULAR | Status: DC | PRN
  Administered 2016-02-27: 30 mg

## 2016-02-27 MED ORDER — SENNA 8.6 MG PO TABS
2.0000 | ORAL_TABLET | Freq: Every day | ORAL | Status: DC
  Administered 2016-02-27: 17.2 mg via ORAL
  Filled 2016-02-27: qty 2

## 2016-02-27 MED ORDER — SODIUM CHLORIDE 0.9 % IR SOLN
Status: DC | PRN
  Administered 2016-02-27: 3000 mL

## 2016-02-27 MED ORDER — BUPIVACAINE HCL (PF) 0.5 % IJ SOLN
INTRAMUSCULAR | Status: AC
  Filled 2016-02-27: qty 30

## 2016-02-27 MED ORDER — ISOPROPYL ALCOHOL 70 % SOLN
Status: AC
  Filled 2016-02-27: qty 480

## 2016-02-27 MED ORDER — DEXAMETHASONE SODIUM PHOSPHATE 10 MG/ML IJ SOLN
INTRAMUSCULAR | Status: DC | PRN
  Administered 2016-02-27: 10 mg via INTRAVENOUS

## 2016-02-27 MED ORDER — TRANEXAMIC ACID 1000 MG/10ML IV SOLN
1000.0000 mg | INTRAVENOUS | Status: AC
  Administered 2016-02-27: 1000 mg via INTRAVENOUS
  Filled 2016-02-27: qty 10

## 2016-02-27 MED ORDER — HYDROMORPHONE HCL 1 MG/ML IJ SOLN
INTRAMUSCULAR | Status: AC
  Filled 2016-02-27: qty 1

## 2016-02-27 MED ORDER — MIDAZOLAM HCL 2 MG/2ML IJ SOLN
INTRAMUSCULAR | Status: AC
  Filled 2016-02-27: qty 2

## 2016-02-27 MED ORDER — ACETAMINOPHEN 325 MG PO TABS: 650.0000 mg | ORAL_TABLET | Freq: Four times a day (QID) | ORAL | Status: DC | PRN

## 2016-02-27 MED ORDER — MENTHOL 3 MG MT LOZG: 1.0000 | LOZENGE | OROMUCOSAL | Status: DC | PRN

## 2016-02-27 MED ORDER — LIDOCAINE HCL (CARDIAC) 20 MG/ML IV SOLN
INTRAVENOUS | Status: AC
  Filled 2016-02-27: qty 5

## 2016-02-27 MED ORDER — ASPIRIN EC 81 MG PO TBEC
81.0000 mg | DELAYED_RELEASE_TABLET | Freq: Two times a day (BID) | ORAL | Status: DC
  Administered 2016-02-27 – 2016-02-28 (×2): 81 mg via ORAL
  Filled 2016-02-27 (×2): qty 1

## 2016-02-27 MED ORDER — HYDROMORPHONE HCL 1 MG/ML IJ SOLN
0.2500 mg | INTRAMUSCULAR | Status: DC | PRN
  Administered 2016-02-27 (×2): 0.5 mg via INTRAVENOUS

## 2016-02-27 MED ORDER — SODIUM CHLORIDE 0.9 % IJ SOLN
INTRAMUSCULAR | Status: AC
Start: 2016-02-27 — End: 2016-02-27
  Filled 2016-02-27: qty 50

## 2016-02-27 MED ORDER — METOCLOPRAMIDE HCL 5 MG PO TABS: 5.0000 mg | ORAL_TABLET | Freq: Three times a day (TID) | ORAL | Status: DC | PRN

## 2016-02-27 MED ORDER — HYDROGEN PEROXIDE 3 % EX SOLN
CUTANEOUS | Status: AC
  Filled 2016-02-27: qty 473

## 2016-02-27 MED ORDER — SODIUM CHLORIDE 0.9 % IV SOLN: INTRAVENOUS | Status: DC

## 2016-02-27 MED ORDER — MAGNESIUM CITRATE PO SOLN: 1.0000 | Freq: Once | ORAL | Status: DC | PRN

## 2016-02-27 MED ORDER — DOCUSATE SODIUM 100 MG PO CAPS
100.0000 mg | ORAL_CAPSULE | Freq: Two times a day (BID) | ORAL | Status: DC
  Administered 2016-02-27 – 2016-02-28 (×2): 100 mg via ORAL
  Filled 2016-02-27 (×2): qty 1

## 2016-02-27 SURGICAL SUPPLY — 41 items
BAG DECANTER FOR FLEXI CONT (MISCELLANEOUS) IMPLANT
BAG ZIPLOCK 12X15 (MISCELLANEOUS) IMPLANT
CAPT HIP TOTAL 2 ×3 IMPLANT
CHLORAPREP W/TINT 26ML (MISCELLANEOUS) ×3 IMPLANT
CLOTH BEACON ORANGE TIMEOUT ST (SAFETY) ×3 IMPLANT
COVER PERINEAL POST (MISCELLANEOUS) ×3 IMPLANT
DECANTER SPIKE VIAL GLASS SM (MISCELLANEOUS) ×3 IMPLANT
DRAPE LG THREE QUARTER DISP (DRAPES) ×6 IMPLANT
DRAPE STERI IOBAN 125X83 (DRAPES) ×3 IMPLANT
DRAPE U-SHAPE 47X51 STRL (DRAPES) ×6 IMPLANT
DRSG AQUACEL AG ADV 3.5X10 (GAUZE/BANDAGES/DRESSINGS) ×3 IMPLANT
ELECT REM PT RETURN 15FT ADLT (MISCELLANEOUS) ×3 IMPLANT
GAUZE SPONGE 4X4 12PLY STRL (GAUZE/BANDAGES/DRESSINGS) ×3 IMPLANT
GLOVE BIO SURGEON STRL SZ8.5 (GLOVE) ×6 IMPLANT
GLOVE BIOGEL PI IND STRL 8.5 (GLOVE) ×1 IMPLANT
GLOVE BIOGEL PI INDICATOR 8.5 (GLOVE) ×2
GOWN SPEC L3 XXLG W/TWL (GOWN DISPOSABLE) ×3 IMPLANT
HANDPIECE INTERPULSE COAX TIP (DISPOSABLE) ×2
HOLDER FOLEY CATH W/STRAP (MISCELLANEOUS) ×3 IMPLANT
HOOD PEEL AWAY FLYTE STAYCOOL (MISCELLANEOUS) ×6 IMPLANT
LIQUID BAND (GAUZE/BANDAGES/DRESSINGS) ×6 IMPLANT
MARKER SKIN DUAL TIP RULER LAB (MISCELLANEOUS) ×3 IMPLANT
NEEDLE SPNL 18GX3.5 QUINCKE PK (NEEDLE) ×3 IMPLANT
PACK ANTERIOR HIP CUSTOM (KITS) ×3 IMPLANT
SAW OSC TIP CART 19.5X105X1.3 (SAW) ×3 IMPLANT
SEALER BIPOLAR AQUA 6.0 (INSTRUMENTS) ×3 IMPLANT
SET HNDPC FAN SPRY TIP SCT (DISPOSABLE) ×1 IMPLANT
SOL PREP POV-IOD 4OZ 10% (MISCELLANEOUS) ×3 IMPLANT
SUT ETHIBOND NAB CT1 #1 30IN (SUTURE) ×6 IMPLANT
SUT MNCRL AB 3-0 PS2 18 (SUTURE) ×3 IMPLANT
SUT MON AB 2-0 CT1 36 (SUTURE) ×9 IMPLANT
SUT VIC AB 1 CT1 36 (SUTURE) ×3 IMPLANT
SUT VIC AB 2-0 CT1 27 (SUTURE) ×2
SUT VIC AB 2-0 CT1 TAPERPNT 27 (SUTURE) ×1 IMPLANT
SUT VLOC 180 0 24IN GS25 (SUTURE) ×3 IMPLANT
SYR 50ML LL SCALE MARK (SYRINGE) ×3 IMPLANT
TRAY FOLEY W/METER SILVER 14FR (SET/KITS/TRAYS/PACK) IMPLANT
TRAY FOLEY W/METER SILVER 16FR (SET/KITS/TRAYS/PACK) ×3 IMPLANT
WATER STERILE IRR 1500ML POUR (IV SOLUTION) ×3 IMPLANT
YANKAUER SUCT BULB TIP 10FT TU (MISCELLANEOUS) ×3 IMPLANT
YANKAUER SUCT BULB TIP NO VENT (SUCTIONS) ×3 IMPLANT

## 2016-02-27 NOTE — Anesthesia Postprocedure Evaluation (Signed)
Anesthesia Post Note  Patient: Valera CastleDavid Hengst  Procedure(s) Performed: Procedure(s) (LRB): LEFT TOTAL HIP ARTHROPLASTY ANTERIOR APPROACH (Left)  Patient location during evaluation: PACU Anesthesia Type: General Level of consciousness: awake and alert Pain management: pain level controlled Vital Signs Assessment: post-procedure vital signs reviewed and stable Respiratory status: spontaneous breathing, nonlabored ventilation and respiratory function stable Cardiovascular status: blood pressure returned to baseline and stable Postop Assessment: no signs of nausea or vomiting Anesthetic complications: no    Last Vitals:  Filed Vitals:   02/27/16 1545 02/27/16 1600  BP: 146/92 130/81  Pulse: 82 71  Temp:    Resp: 22 15    Last Pain:  Filed Vitals:   02/27/16 1607  PainSc: 4                  Katherine Syme,W. EDMOND

## 2016-02-27 NOTE — Anesthesia Procedure Notes (Signed)
Procedure Name: Intubation Date/Time: 02/27/2016 12:30 PM Performed by: Leroy LibmanEARDON, Aden Sek L Patient Re-evaluated:Patient Re-evaluated prior to inductionOxygen Delivery Method: Circle system utilized Preoxygenation: Pre-oxygenation with 100% oxygen Intubation Type: IV induction Ventilation: Mask ventilation without difficulty and Oral airway inserted - appropriate to patient size Laryngoscope Size: Miller and 3 Grade View: Grade I Tube type: Oral Tube size: 8.0 mm Number of attempts: 1 Airway Equipment and Method: Stylet Placement Confirmation: ETT inserted through vocal cords under direct vision,  breath sounds checked- equal and bilateral and positive ETCO2 Secured at: 22 cm Tube secured with: Tape Dental Injury: Teeth and Oropharynx as per pre-operative assessment

## 2016-02-27 NOTE — Op Note (Signed)
OPERATIVE REPORT  SURGEON: Samson FredericBrian Sidharth Leverette, MD   ASSISTANT: Skip MayerBlair Roberts, PA-C.  PREOPERATIVE DIAGNOSIS: Left hip arthritis.   POSTOPERATIVE DIAGNOSIS: Left hip arthritis.   PROCEDURE: Left total hip arthroplasty, anterior approach.   IMPLANTS: DePuy Tri Lock stem, size 8, hi offset. DePuy Pinnacle Cup, size 58 mm. DePuy Altrx liner, size 36 by 58 mm, +4 neutral. DePuy Biolox ceramic head ball, size 36 + 1.5 mm.  ANESTHESIA:  General  ESTIMATED BLOOD LOSS: 650 mL.  ANTIBIOTICS: 2 g ancef.  DRAINS: None.  COMPLICATIONS: None.   CONDITION: PACU - hemodynamically stable.Marland Kitchen.   BRIEF CLINICAL NOTE: Phillip Thomas is a 55 y.o. male with a long-standing history of Left hip arthritis. After failing conservative management, the patient was indicated for total hip arthroplasty. The risks, benefits, and alternatives to the procedure were explained, and the patient elected to proceed.  PROCEDURE IN DETAIL: Surgical site was marked by myself. Spinal anesthesia was obtained in the pre-op holding area. Once inside the operative room, a foley catheter was inserted. The patient was then positioned on the Hana table. All bony prominences were well padded. The hip was prepped and draped in the normal sterile surgical fashion. A time-out was called verifying side and site of surgery. The patient received IV antibiotics within 60 minutes of beginning the procedure.  The direct anterior approach to the hip was performed through the Hueter interval. Lateral femoral circumflex vessels were treated with the Auqumantys. The anterior capsule was exposed and an inverted T capsulotomy was made.The femoral neck cut was made to the level of the templated cut. A corkscrew was placed into the head and the head was removed. The femoral head was found to have eburnated bone. The head was passed to the back table and was measured.  Acetabular exposure was achieved, and the pulvinar and labrum were excised.  Sequental reaming of the acetabulum was then performed up to a size 57 mm reamer. A 58 mm cup was then opened and impacted into place at approximately 40 degrees of abduction and 20 degrees of anteversion. The final polyethylene liner was impacted into place and acetabular osteophytes were removed.   I then gained femoral exposure taking care to protect the abductors and greater trochanter. This was performed using standard external rotation, extension, and adduction. The capsule was peeled off the inner aspect of the greater trochanter, taking care to preserve the short external rotators. A cookie cutter was used to enter the femoral canal, and then the femoral canal finder was placed. Sequential broaching was performed up to a size 8. Calcar planer was used on the femoral neck remnant. I placed a hi offset neck and a trial head ball. The hip was reduced. Leg lengths and offset were checked fluoroscopically. The hip was dislocated and trial components were removed. The final implants were placed, and the hip was reduced.  Fluoroscopy was used to confirm component position and leg lengths. At 90 degrees of external rotation and full extension, the hip was stable to an anterior directed force.  The wound was copiously irrigated with a dilute betadine solution followed by normal saline. Marcaine solution was injected into the periarticular soft tissue. The wound was closed in layers using #1 Vicryl and V-Loc for the fascia, 2-0 Vicryl for the subcutaneous fat, 2-0 Monocryl for the deep dermal layer, 3-0 running Monocryl subcuticular stitch, and Dermabond for the skin. Once the glue was fully dried, an Aquacell Ag dressing was applied. The patient was transported to the recovery  room in stable condition. Sponge, needle, and instrument counts were correct at the end of the case x2. The patient tolerated the procedure well and there were no known complications.  Please note that a surgical  assistant was a medical necessity for this procedure to perform it in a safe and expeditious manner. Assistant was necessary to provide appropriate retraction of vital neurovascular structures, to prevent femoral fracture, and to allow for anatomic placement of the prosthesis.

## 2016-02-27 NOTE — H&P (View-Only) (Signed)
TOTAL HIP ADMISSION H&P  Patient is admitted for left total hip arthroplasty.  Subjective:  Chief Complaint: left hip pain  HPI: Phillip Thomas, 55 y.o. male, has a history of pain and functional disability in the left hip(s) due to arthritis and patient has failed non-surgical conservative treatments for greater than 12 weeks to include NSAID's and/or analgesics, flexibility and strengthening excercises, use of assistive devices, weight reduction as appropriate and activity modification.  Onset of symptoms was gradual starting 4 years ago with gradually worsening course since that time.The patient noted no past surgery on the left hip(s).  Patient currently rates pain in the left hip at 10 out of 10 with activity. Patient has night pain, worsening of pain with activity and weight bearing, pain that interfers with activities of daily living and pain with passive range of motion. Patient has evidence of subchondral cysts, subchondral sclerosis, periarticular osteophytes and joint space narrowing by imaging studies. This condition presents safety issues increasing the risk of falls. There is no current active infection.  There are no active problems to display for this patient.  Past Medical History  Diagnosis Date  . Hypertension   . Arthritis     Past Surgical History  Procedure Laterality Date  . Hernia repair    . Appendectomy       (Not in a hospital admission) No Known Allergies  Social History  Substance Use Topics  . Smoking status: Current Every Day Smoker  . Smokeless tobacco: Not on file  . Alcohol Use: 2.4 oz/week    4 Cans of beer per week     Comment: 4 cans a day, 3x a week.    No family history on file.   Review of Systems  Constitutional: Negative.   HENT: Negative.   Eyes: Negative.   Respiratory: Negative.   Cardiovascular: Negative.   Gastrointestinal: Negative.   Genitourinary: Negative.   Musculoskeletal: Positive for joint pain.  Skin: Negative.    Neurological: Negative.   Endo/Heme/Allergies: Negative.   Psychiatric/Behavioral: Positive for depression and memory loss. The patient is nervous/anxious and has insomnia.     Objective:  Physical Exam  Vitals reviewed. Constitutional: He is oriented to person, place, and time. He appears well-developed and well-nourished.  HENT:  Head: Normocephalic and atraumatic.  Eyes: Conjunctivae and EOM are normal. Pupils are equal, round, and reactive to light.  Neck: Normal range of motion.  Cardiovascular: Normal rate and regular rhythm.   Respiratory: Effort normal and breath sounds normal. No respiratory distress.  GI: Soft. Bowel sounds are normal. He exhibits no distension.  Genitourinary:  deferred  Musculoskeletal:       Left hip: He exhibits decreased range of motion and bony tenderness.  Neurological: He is alert and oriented to person, place, and time. He has normal reflexes.  Skin: Skin is warm and dry.  Psychiatric: He has a normal mood and affect. His behavior is normal. Judgment and thought content normal.    Vital signs in last 24 hours: @VSRANGES @  Labs:   Estimated body mass index is 31.25 kg/(m^2) as calculated from the following:   Height as of 05/16/13: 6\' 3"  (1.905 m).   Weight as of 05/16/13: 113.399 kg (250 lb).   Imaging Review Plain radiographs demonstrate severe degenerative joint disease of the left hip(s). The bone quality appears to be adequate for age and reported activity level.  Assessment/Plan:  End stage arthritis, left hip(s)  The patient history, physical examination, clinical judgement of the provider  and imaging studies are consistent with end stage degenerative joint disease of the left hip(s) and total hip arthroplasty is deemed medically necessary. The treatment options including medical management, injection therapy, arthroscopy and arthroplasty were discussed at length. The risks and benefits of total hip arthroplasty were presented and  reviewed. The risks due to aseptic loosening, infection, stiffness, dislocation/subluxation,  thromboembolic complications and other imponderables were discussed.  The patient acknowledged the explanation, agreed to proceed with the plan and consent was signed. Patient is being admitted for inpatient treatment for surgery, pain control, PT, OT, prophylactic antibiotics, VTE prophylaxis, progressive ambulation and ADL's and discharge planning.The patient is planning to be discharged home with home health services

## 2016-02-27 NOTE — Transfer of Care (Signed)
Immediate Anesthesia Transfer of Care Note  Patient: Phillip CastleDavid Thomas  Procedure(s) Performed: Procedure(s): LEFT TOTAL HIP ARTHROPLASTY ANTERIOR APPROACH (Left)  Patient Location: PACU  Anesthesia Type:General  Level of Consciousness: sedated  Airway & Oxygen Therapy: Patient Spontanous Breathing and Patient connected to face mask oxygen  Post-op Assessment: Report given to RN and Post -op Vital signs reviewed and stable  Post vital signs: Reviewed and stable  Last Vitals:  Filed Vitals:   02/27/16 1034  BP: 145/89  Pulse: 65  Temp: 36.8 C  Resp: 18    Last Pain:  Filed Vitals:   02/27/16 1055  PainSc: 7       Patients Stated Pain Goal: 4 (02/27/16 1053)  Complications: No apparent anesthesia complications

## 2016-02-27 NOTE — Interval H&P Note (Signed)
History and Physical Interval Note:  02/27/2016 12:12 PM  Phillip Thomas  has presented today for surgery, with the diagnosis of DJD Left Hip  The various methods of treatment have been discussed with the patient and family. After consideration of risks, benefits and other options for treatment, the patient has consented to  Procedure(s): LEFT TOTAL HIP ARTHROPLASTY ANTERIOR APPROACH (Left) as a surgical intervention .  The patient's history has been reviewed, patient examined, no change in status, stable for surgery.  I have reviewed the patient's chart and labs.  Questions were answered to the patient's satisfaction.     Tenecia Ignasiak, Cloyde ReamsBrian James

## 2016-02-27 NOTE — Discharge Instructions (Signed)
°Dr. Almetta Liddicoat °Joint Replacement Specialist °[facility] °[address] °, [place] [address] °[phone] ° ° °TOTAL HIP REPLACEMENT POSTOPERATIVE DIRECTIONS ° ° ° °Hip Rehabilitation, Guidelines Following Surgery  ° °WEIGHT BEARING °Weight bearing as tolerated with assist device (walker, cane, etc) as directed, use it as long as suggested by your surgeon or therapist, typically at least 4-6 weeks. ° °The results of a hip operation are greatly improved after range of motion and muscle strengthening exercises. Follow all safety measures which are given to protect your hip. If any of these exercises cause increased pain or swelling in your joint, decrease the amount until you are comfortable again. Then slowly increase the exercises. Call your caregiver if you have problems or questions.  ° °HOME CARE INSTRUCTIONS  °Most of the following instructions are designed to prevent the dislocation of your new hip.  °Remove items at home which could result in a fall. This includes throw rugs or furniture in walking pathways.  °Continue medications as instructed at time of discharge. °· You may have some home medications which will be placed on hold until you complete the course of blood thinner medication. °· You may start showering once you are discharged home. Do not remove your dressing. °Do not put on socks or shoes without following the instructions of your caregivers.   °Sit on chairs with arms. Use the chair arms to help push yourself up when arising.  °Arrange for the use of a toilet seat elevator so you are not sitting low.  °· Walk with walker as instructed.  °You may resume a sexual relationship in one month or when given the OK by your caregiver.  °Use walker as long as suggested by your caregivers.  °You may put full weight on your legs and walk as much as is comfortable. °Avoid periods of inactivity such as sitting longer than an hour when not asleep. This helps prevent  blood clots.  °You may return to work once you are cleared by your surgeon.  °Do not drive a car for 6 weeks or until released by your surgeon.  °Do not drive while taking narcotics.  °Wear elastic stockings for two weeks following surgery during the day but you may remove then at night.  °Make sure you keep all of your appointments after your operation with all of your doctors and caregivers. You should call the office at the above phone number and make an appointment for approximately two weeks after the date of your surgery. °Please pick up a stool softener and laxative for home use as long as you are requiring pain medications. °· ICE to the affected hip every three hours for 30 minutes at a time and then as needed for pain and swelling. Continue to use ice on the hip for pain and swelling from surgery. You may notice swelling that will progress down to the foot and ankle.  This is normal after surgery.  Elevate the leg when you are not up walking on it.   °It is important for you to complete the blood thinner medication as prescribed by your doctor. °· Continue to use the breathing machine which will help keep your temperature down.  It is common for your temperature to cycle up and down following surgery, especially at night when you are not up moving around and exerting yourself.  The breathing machine keeps your lungs expanded and your temperature down. ° °RANGE OF MOTION AND STRENGTHENING EXERCISES  °These exercises are   designed to help you keep full movement of your hip joint. Follow your caregiver's or physical therapist's instructions. Perform all exercises about fifteen times, three times per day or as directed. Exercise both hips, even if you have had only one joint replacement. These exercises can be done on a training (exercise) mat, on the floor, on a table or on a bed. Use whatever works the best and is most comfortable for you. Use music or television while you are exercising so that the exercises  are a pleasant break in your day. This will make your life better with the exercises acting as a break in routine you can look forward to.  °Lying on your back, slowly slide your foot toward your buttocks, raising your knee up off the floor. Then slowly slide your foot back down until your leg is straight again.  °Lying on your back spread your legs as far apart as you can without causing discomfort.  °Lying on your side, raise your upper leg and foot straight up from the floor as far as is comfortable. Slowly lower the leg and repeat.  °Lying on your back, tighten up the muscle in the front of your thigh (quadriceps muscles). You can do this by keeping your leg straight and trying to raise your heel off the floor. This helps strengthen the largest muscle supporting your knee.  °Lying on your back, tighten up the muscles of your buttocks both with the legs straight and with the knee bent at a comfortable angle while keeping your heel on the floor.  ° °SKILLED REHAB INSTRUCTIONS: °If the patient is transferred to a skilled rehab facility following release from the hospital, a list of the current medications will be sent to the facility for the patient to continue.  When discharged from the skilled rehab facility, please have the facility set up the patient's Home Health Physical Therapy prior to being released. Also, the skilled facility will be responsible for providing the patient with their medications at time of release from the facility to include their pain medication and their blood thinner medication. If the patient is still at the rehab facility at time of the two week follow up appointment, the skilled rehab facility will also need to assist the patient in arranging follow up appointment in our office and any transportation needs. ° °MAKE SURE YOU:  °Understand these instructions.  °Will watch your condition.  °Will get help right away if you are not doing well or get worse. ° °Pick up stool softner and  laxative for home use following surgery while on pain medications. °Do not remove your dressing. °The dressing is waterproof--it is OK to take showers. °Continue to use ice for pain and swelling after surgery. °Do not use any lotions or creams on the incision until instructed by your surgeon. °Total Hip Protocol. ° ° °

## 2016-02-27 NOTE — Anesthesia Preprocedure Evaluation (Addendum)
Anesthesia Evaluation  Patient identified by MRN, date of birth, ID band Patient awake    Reviewed: Allergy & Precautions, H&P , NPO status , Patient's Chart, lab work & pertinent test results  Airway Mallampati: II  TM Distance: >3 FB Neck ROM: Full    Dental no notable dental hx. (+) Teeth Intact, Dental Advisory Given   Pulmonary Current Smoker,    Pulmonary exam normal breath sounds clear to auscultation       Cardiovascular hypertension,  Rhythm:Regular Rate:Normal     Neuro/Psych Anxiety Depression negative neurological ROS     GI/Hepatic negative GI ROS, Neg liver ROS,   Endo/Other  negative endocrine ROS  Renal/GU negative Renal ROS  negative genitourinary   Musculoskeletal  (+) Arthritis , Osteoarthritis,    Abdominal   Peds  Hematology negative hematology ROS (+)   Anesthesia Other Findings   Reproductive/Obstetrics negative OB ROS                            Anesthesia Physical Anesthesia Plan  ASA: II  Anesthesia Plan: General   Post-op Pain Management:    Induction: Intravenous  Airway Management Planned: Oral ETT  Additional Equipment:   Intra-op Plan:   Post-operative Plan: Extubation in OR  Informed Consent: I have reviewed the patients History and Physical, chart, labs and discussed the procedure including the risks, benefits and alternatives for the proposed anesthesia with the patient or authorized representative who has indicated his/her understanding and acceptance.   Dental advisory given  Plan Discussed with: CRNA  Anesthesia Plan Comments:        Anesthesia Quick Evaluation

## 2016-02-28 LAB — BASIC METABOLIC PANEL
Anion gap: 12 (ref 5–15)
BUN: 19 mg/dL (ref 6–20)
CALCIUM: 8.2 mg/dL — AB (ref 8.9–10.3)
CO2: 25 mmol/L (ref 22–32)
CREATININE: 0.9 mg/dL (ref 0.61–1.24)
Chloride: 99 mmol/L — ABNORMAL LOW (ref 101–111)
GFR calc Af Amer: >60 mL/min (ref >60)
GFR calc non Af Amer: >60 mL/min (ref >60)
GLUCOSE: 144 mg/dL — AB (ref 65–99)
Potassium: 4 mmol/L (ref 3.5–5.1)
Sodium: 136 mmol/L (ref 135–145)

## 2016-02-28 LAB — CBC
HEMATOCRIT: 32.3 % — AB (ref 39.0–52.0)
Hemoglobin: 11.1 g/dL — ABNORMAL LOW (ref 13.0–17.0)
MCH: 31 pg (ref 26.0–34.0)
MCHC: 34.4 g/dL (ref 30.0–36.0)
MCV: 90.2 fL (ref 78.0–100.0)
PLATELETS: 193 10*3/uL (ref 150–400)
RBC: 3.58 MIL/uL — ABNORMAL LOW (ref 4.22–5.81)
RDW: 13.8 % (ref 11.5–15.5)
WBC: 12.4 10*3/uL — AB (ref 4.0–10.5)

## 2016-02-28 MED ORDER — ASPIRIN 81 MG PO TBEC: 81.0000 mg | DELAYED_RELEASE_TABLET | Freq: Two times a day (BID) | ORAL | Status: DC

## 2016-02-28 MED ORDER — SENNA 8.6 MG PO TABS: 2.0000 | ORAL_TABLET | Freq: Every day | ORAL | Status: DC

## 2016-02-28 MED ORDER — HYDROCODONE-ACETAMINOPHEN 5-325 MG PO TABS: 1.0000 | ORAL_TABLET | ORAL | Status: DC | PRN

## 2016-02-28 MED ORDER — DOCUSATE SODIUM 100 MG PO CAPS: 100.0000 mg | ORAL_CAPSULE | Freq: Two times a day (BID) | ORAL | Status: DC

## 2016-02-28 MED ORDER — ONDANSETRON HCL 4 MG PO TABS: 4.0000 mg | ORAL_TABLET | Freq: Four times a day (QID) | ORAL | Status: DC | PRN

## 2016-02-28 NOTE — Progress Notes (Signed)
   Subjective:  Patient reports pain as mild to moderate.  No c/o. Denies N/V/CP/SOB.  Objective:   VITALS:   Filed Vitals:   02/27/16 1935 02/27/16 2242 02/28/16 0236 02/28/16 0620  BP: 148/79 120/76 112/58 110/65  Pulse: 59 63 61 65  Temp: 98 F (36.7 C) 97.8 F (36.6 C) 97.9 F (36.6 C) 97.9 F (36.6 C)  TempSrc: Oral Oral Oral Oral  Resp: 17 17 17 17   Height:      Weight:      SpO2: 96% 92% 97% 97%    ABD soft Sensation intact distally Intact pulses distally Dorsiflexion/Plantar flexion intact Incision: dressing C/D/I Compartment soft   Lab Results  Component Value Date   WBC 12.4* 02/28/2016   HGB 11.1* 02/28/2016   HCT 32.3* 02/28/2016   MCV 90.2 02/28/2016   PLT 193 02/28/2016   BMET    Component Value Date/Time   NA 136 02/28/2016 0359   K 4.0 02/28/2016 0359   CL 99* 02/28/2016 0359   CO2 25 02/28/2016 0359   GLUCOSE 144* 02/28/2016 0359   BUN 19 02/28/2016 0359   CREATININE 0.90 02/28/2016 0359   CALCIUM 8.2* 02/28/2016 0359   GFRNONAA >60 02/28/2016 0359   GFRAA >60 02/28/2016 0359     Assessment/Plan: 1 Day Post-Op   Principal Problem:   Primary osteoarthritis of left hip   WBAT with walker DVT ppx: ASA, SCDs, TEDs PO pain controlPT/OT D/C home with HHPT   Efren Kross, Cloyde ReamsBrian James 02/28/2016, 7:33 AM   Samson FredericBrian Oretha Weismann, MD Cell [phone]

## 2016-02-28 NOTE — Care Management Note (Signed)
Case Management Note  Patient Details  Name: Phillip Thomas MRN: [id number] Date of Birth: Feb 08, 1961  Subjective/Objective: 55 y/o m admitted w/OA L hip.POD#1 L THA. From home w/spouse. Has rw,3n1. No insurance-patient able to & willing to pay. [facility] rep Clydie BraunKaren has referral-they can accept.                   Action/Plan:d/c home w/HHC.   Expected Discharge Date:                  Expected Discharge Plan:  Home w Home Health Services  In-House Referral:     Discharge planning Services  CM Consult  Post Acute Care Choice:    Choice offered to:  Patient  DME Arranged:    DME Agency:     HH Arranged:  PT HH Agency:     Status of Service:  In process, will continue to follow  Medicare Important Message Given:    Date Medicare IM Given:    Medicare IM give by:    Date Additional Medicare IM Given:    Additional Medicare Important Message give by:     If discussed at Long Length of Stay Meetings, dates discussed:    Additional Comments:  Lanier ClamMahabir, Dola Lunsford, RN 02/28/2016, 10:59 AM

## 2016-02-28 NOTE — Progress Notes (Signed)
Occupational Therapy Evaluation Patient Details Name: Phillip CastleDavid Thomas MRN: [id number] DOB: 1960-12-21 Today's Date: 02/28/2016    History of Present Illness L THR   Clinical Impression   All education completed and pt questions answered. No further OT needs at this time. Will sign off.    Follow Up Recommendations  No OT follow up;Supervision - Intermittent    Equipment Recommendations  None recommended by OT    Recommendations for Other Services       Precautions / Restrictions Precautions Precautions: Fall Restrictions Weight Bearing Restrictions: No Other Position/Activity Restrictions: WBAT      Mobility Bed Mobility              Transfers                 Balance                                            ADL Overall ADL's : Needs assistance/impaired Eating/Feeding: Independent;Sitting   Grooming: Supervision/safety;Standing   Upper Body Bathing: Set up;Sitting   Lower Body Bathing: Minimal assistance;Sit to/from stand   Upper Body Dressing : Set up;Sitting   Lower Body Dressing: Minimal assistance;Sit to/from stand   Toilet Transfer: Supervision/safety;Ambulation;Regular Toilet;BSC;RW   Toileting- Clothing Manipulation and Hygiene: Supervision/safety;Sit to/from stand   Tub/ Shower Transfer: Walk-in shower;Supervision/safety;Ambulation;Rolling walker   Functional mobility during ADLs: Supervision/safety;Rolling walker       Vision     Perception     Praxis      Pertinent Vitals/Pain Pain Assessment: 0-10 Pain Score: 4  Pain Location: L hip/thigh Pain Descriptors / Indicators: Burning Pain Intervention(s): Limited activity within patient's tolerance;Monitored during session;Repositioned;Ice applied     Hand Dominance     Extremity/Trunk Assessment Upper Extremity Assessment Upper Extremity Assessment: Overall WFL for tasks assessed   Lower Extremity Assessment Lower Extremity Assessment: Defer to  PT evaluation    Cervical / Trunk Assessment Cervical / Trunk Assessment: Normal   Communication Communication Communication: No difficulties   Cognition Arousal/Alertness: Awake/alert Behavior During Therapy: WFL for tasks assessed/performed Overall Cognitive Status: Within Functional Limits for tasks assessed                     General Comments       Exercises      Shoulder Instructions      Home Living Family/patient expects to be discharged to:: Private residence Living Arrangements: Spouse/significant other Available Help at Discharge: Family Type of Home: House Home Access: Stairs to enter Secretary/administratorntrance Stairs-Number of Steps: 3 Entrance Stairs-Rails: Right Home Layout: One level     Bathroom Shower/Tub: Producer, television/film/videoWalk-in shower   Bathroom Toilet: Standard     Home Equipment: Environmental consultantWalker - 2 wheels;Cane - single point;Bedside commode          Prior Functioning/Environment Level of Independence: Independent             OT Diagnosis: Acute pain   OT Problem List: Decreased strength;Decreased range of motion;Decreased knowledge of use of DME or AE;Pain   OT Treatment/Interventions:      OT Goals(Current goals can be found in the care plan section) Acute Rehab OT Goals Patient Stated Goal: Regain IND OT Goal Formulation: All assessment and education complete, DC therapy  OT Frequency:     Barriers to D/C:            Co-evaluation  End of Session Equipment Utilized During Treatment: Engineer, water Communication: Mobility status  Activity Tolerance: Patient tolerated treatment well Patient left: in chair;with call bell/phone within reach;with chair alarm set;with family/visitor present   Time: 1010-1034 OT Time Calculation (min): 24 min Charges:  OT General Charges $OT Visit: 1 Procedure OT Evaluation $OT Eval Low Complexity: 1 Procedure G-Codes:    Phillip Thomas A 03/05/16, 12:52 PM

## 2016-02-28 NOTE — Evaluation (Signed)
Physical Therapy Evaluation Patient Details Name: Phillip Thomas MRN: [id number] DOB: 08/09/1961 Today's Date: 02/28/2016   History of Present Illness  L THR  Clinical Impression  Pt s/p L Thr presents with decreased L LE strength/ROM and post op pain limiting functional mobility.  Pt should progress to dc home with family assist and HHPT follow up.    Follow Up Recommendations Home health PT    Equipment Recommendations  None recommended by PT    Recommendations for Other Services OT consult     Precautions / Restrictions Precautions Precautions: Fall Restrictions Weight Bearing Restrictions: No Other Position/Activity Restrictions: WBAT      Mobility  Bed Mobility Overal bed mobility: Needs Assistance Bed Mobility: Supine to Sit     Supine to sit: Min guard     General bed mobility comments: Cues for sequence and use of R LE to self assist  Transfers Overall transfer level: Needs assistance Equipment used: Rolling walker (2 wheeled) Transfers: Sit to/from Stand Sit to Stand: Min guard         General transfer comment: cues for LE management and use of UEs to self assist  Ambulation/Gait Ambulation/Gait assistance: Min guard Ambulation Distance (Feet): 200 Feet Assistive device: Rolling walker (2 wheeled) Gait Pattern/deviations: Step-to pattern;Step-through pattern;Decreased step length - right;Decreased step length - left;Shuffle;Trunk flexed Gait velocity: decr Gait velocity interpretation: Below normal speed for age/gender General Gait Details: cues for posture, position from RW and initial sequence  Stairs            Wheelchair Mobility    Modified Rankin (Stroke Patients Only)       Balance                                             Pertinent Vitals/Pain Pain Assessment: 0-10 Pain Score: 2  Pain Location: L hip/thigh Pain Descriptors / Indicators: Aching;Burning Pain Intervention(s): Limited activity within  patient's tolerance;Monitored during session;Premedicated before session;Ice applied    Home Living Family/patient expects to be discharged to:: Private residence Living Arrangements: Spouse/significant other Available Help at Discharge: Family Type of Home: House Home Access: Stairs to enter Entrance Stairs-Rails: Right Entrance Stairs-Number of Steps: 3 Home Layout: One level Home Equipment: Environmental consultantWalker - 2 wheels;Cane - single point      Prior Function Level of Independence: Independent               Hand Dominance        Extremity/Trunk Assessment   Upper Extremity Assessment: Overall WFL for tasks assessed           Lower Extremity Assessment: LLE deficits/detail   LLE Deficits / Details: Strength at hip 3-/5 with AAROM at hip to 90 flex and 20 abd  Cervical / Trunk Assessment: Normal  Communication   Communication: No difficulties  Cognition Arousal/Alertness: Awake/alert Behavior During Therapy: WFL for tasks assessed/performed Overall Cognitive Status: Within Functional Limits for tasks assessed                      General Comments      Exercises Total Joint Exercises Ankle Circles/Pumps: AROM;Both;15 reps;Supine Quad Sets: AROM;Both;10 reps;Supine Heel Slides: AAROM;Left;Supine;20 reps Hip ABduction/ADduction: AAROM;Left;Supine;15 reps      Assessment/Plan    PT Assessment Patient needs continued PT services  PT Diagnosis Difficulty walking   PT Problem List Decreased strength;Decreased range of  motion;Decreased activity tolerance;Decreased mobility;Decreased knowledge of use of DME;Pain  PT Treatment Interventions DME instruction;Functional mobility training;Stair training;Gait training;Therapeutic activities;Therapeutic exercise;Patient/family education   PT Goals (Current goals can be found in the Care Plan section) Acute Rehab PT Goals Patient Stated Goal: Regain IND PT Goal Formulation: With patient Time For Goal Achievement:  03/02/16 Potential to Achieve Goals: Good    Frequency 7X/week   Barriers to discharge        Co-evaluation               End of Session Equipment Utilized During Treatment: Gait belt Activity Tolerance: Patient tolerated treatment well Patient left: in chair;with call bell/phone within reach;with family/visitor present Nurse Communication: Mobility status         Time: 0902-0940 PT Time Calculation (min) (ACUTE ONLY): 38 min   Charges:   PT Evaluation $PT Eval Low Complexity: 1 Procedure PT Treatments $Gait Training: 8-22 mins $Therapeutic Exercise: 8-22 mins   PT G Codes:        [name] 03-03-16, 12:45 PM

## 2016-02-28 NOTE — Progress Notes (Signed)
Utilization review completed.  

## 2016-02-28 NOTE — Progress Notes (Signed)
Physical Therapy Treatment Patient Details Name: Phillip CastleDavid Thomas MRN: [id number] DOB: 08-16-1961 Today's Date: 02/28/2016    History of Present Illness L THR    PT Comments    Pt progressing well and eager for dc.  Reviewed stairs and car transfers.  Follow Up Recommendations  Home health PT     Equipment Recommendations  None recommended by PT    Recommendations for Other Services OT consult     Precautions / Restrictions Precautions Precautions: Fall Restrictions Weight Bearing Restrictions: No Other Position/Activity Restrictions: WBAT    Mobility  Bed Mobility                  Transfers Overall transfer level: Needs assistance Equipment used: Rolling walker (2 wheeled) Transfers: Sit to/from Stand Sit to Stand: Supervision         General transfer comment: min cues for LE management and use of UEs to self assist  Ambulation/Gait Ambulation/Gait assistance: Min guard;Supervision Ambulation Distance (Feet): 300 Feet Assistive device: Rolling walker (2 wheeled) Gait Pattern/deviations: Step-to pattern;Step-through pattern;Decreased step length - right;Decreased step length - left;Shuffle;Trunk flexed;Wide base of support Gait velocity: decr   General Gait Details: min cues for posture, position from RW and initial sequence   Stairs Stairs: Yes Stairs assistance: Min guard Stair Management: One rail Left;Step to pattern;Forwards;With cane Number of Stairs: 4 General stair comments: cues for sequence and foot/cane placement  Wheelchair Mobility    Modified Rankin (Stroke Patients Only)       Balance                                    Cognition Arousal/Alertness: Awake/alert Behavior During Therapy: WFL for tasks assessed/performed Overall Cognitive Status: Within Functional Limits for tasks assessed                      Exercises Total Joint Exercises Ankle Circles/Pumps: AROM;Both;15 reps;Supine Quad Sets:  AROM;Both;10 reps;Supine    General Comments        Pertinent Vitals/Pain Pain Assessment: 0-10 Pain Score: 3  Pain Location: L hip Pain Descriptors / Indicators: Aching;Sore Pain Intervention(s): Limited activity within patient's tolerance;Monitored during session;Premedicated before session;Ice applied    Home Living Family/patient expects to be discharged to:: Private residence Living Arrangements: Spouse/significant other Available Help at Discharge: Family Type of Home: House Home Access: Stairs to enter Entrance Stairs-Rails: Right Home Layout: One level Home Equipment: Environmental consultantWalker - 2 wheels;Cane - single point;Bedside commode      Prior Function Level of Independence: Independent          PT Goals (current goals can now be found in the care plan section) Acute Rehab PT Goals Patient Stated Goal: Regain IND PT Goal Formulation: With patient Time For Goal Achievement: 03/02/16 Potential to Achieve Goals: Good Progress towards PT goals: Progressing toward goals    Frequency  7X/week    PT Plan Current plan remains appropriate    Co-evaluation             End of Session Equipment Utilized During Treatment: Gait belt Activity Tolerance: Patient tolerated treatment well Patient left: in chair;with call bell/phone within reach;with family/visitor present     Time: 1347-1420 PT Time Calculation (min) (ACUTE ONLY): 33 min  Charges:  $Gait Training: 8-22 mins $Therapeutic Activity: 8-22 mins  G Codes:      Phillip Thomas March 23, 2016, 3:10 PM

## 2016-02-28 NOTE — Discharge Summary (Signed)
Physician Discharge Summary  Patient ID: Phillip Thomas MRN: [id number] DOB/AGE: 1961/05/03 55 y.o.  Admit date: 02/27/2016 Discharge date: 02/28/2016  Admission Diagnoses:  Primary osteoarthritis of left hip  Discharge Diagnoses:  Principal Problem:   Primary osteoarthritis of left hip   Past Medical History  Diagnosis Date  . Hypertension     off all bp meds for last year and 1/2   . Depression   . Anxiety   . Arthritis     oa  . Abdominal aortic aneurysm (AAA) without rupture (HCC)   . PTSD (post-traumatic stress disorder)     Surgeries: Procedure(s): LEFT TOTAL HIP ARTHROPLASTY ANTERIOR APPROACH on 02/27/2016   Consultants (if any):    Discharged Condition: Improved  Hospital Course: Phillip Thomas is an 55 y.o. male who was admitted 02/27/2016 with a diagnosis of Primary osteoarthritis of left hip and went to the operating room on 02/27/2016 and underwent the above named procedures.    He was given perioperative antibiotics:  Anti-infectives    Start     Dose/Rate Route Frequency Ordered Stop   02/27/16 1800  ceFAZolin (ANCEF) IVPB 2g/100 mL premix     2 g 200 mL/hr over 30 Minutes Intravenous Every 6 hours 02/27/16 1656 02/28/16 0001   02/27/16 0600  ceFAZolin (ANCEF) 3 g in dextrose 5 % 50 mL IVPB     3 g 130 mL/hr over 30 Minutes Intravenous On call to O.R. 02/26/16 1341 02/27/16 1234    .  He was given sequential compression devices, early ambulation, and ASA for DVT prophylaxis.  He benefited maximally from the hospital stay and there were no complications.    Recent vital signs:  Filed Vitals:   02/28/16 0236 02/28/16 0620  BP: 112/58 110/65  Pulse: 61 65  Temp: 97.9 F (36.6 C) 97.9 F (36.6 C)  Resp: 17 17    Recent laboratory studies:  Lab Results  Component Value Date   HGB 11.1* 02/28/2016   HGB 14.1 02/18/2016   HGB 15.2 05/16/2013   Lab Results  Component Value Date   WBC 12.4* 02/28/2016   PLT 193 02/28/2016   No results found for:  INR Lab Results  Component Value Date   NA 136 02/28/2016   K 4.0 02/28/2016   CL 99* 02/28/2016   CO2 25 02/28/2016   BUN 19 02/28/2016   CREATININE 0.90 02/28/2016   GLUCOSE 144* 02/28/2016    Discharge Medications:     Medication List    STOP taking these medications        diclofenac sodium 1 % Gel  Commonly known as:  VOLTAREN      TAKE these medications        aspirin 81 MG EC tablet  Take 1 tablet (81 mg total) by mouth 2 (two) times daily after a meal.     docusate sodium 100 MG capsule  Commonly known as:  COLACE  Take 1 capsule (100 mg total) by mouth 2 (two) times daily.     FISH OIL PO  Take 1 capsule by mouth daily.     GLUCOSAMINE CHONDR COMPLEX PO  Take 1 tablet by mouth daily.     HYDROcodone-acetaminophen 5-325 MG tablet  Commonly known as:  NORCO  Take 1-2 tablets by mouth every 4 (four) hours as needed for moderate pain.     ibuprofen 200 MG tablet  Commonly known as:  ADVIL,MOTRIN  Take 800 mg by mouth every 6 (six) hours as needed (For pain.).  NIACIN PO  Take 1 tablet by mouth daily.     ondansetron 4 MG tablet  Commonly known as:  ZOFRAN  Take 1 tablet (4 mg total) by mouth every 6 (six) hours as needed for nausea.     RED YEAST RICE PO  Take 1 tablet by mouth daily.     senna 8.6 MG Tabs tablet  Commonly known as:  SENOKOT  Take 2 tablets (17.2 mg total) by mouth at bedtime.     TURMERIC PO  Take 1 tablet by mouth daily.     VITAMIN B-6 PO  Take 1 tablet by mouth daily.        Diagnostic Studies: Dg Pelvis Portable  02/27/2016  CLINICAL DATA:  Status post left hip replacement EXAM: PORTABLE PELVIS 1-2 VIEWS COMPARISON:  None. FINDINGS: Left hip prosthesis is noted in satisfactory position. No acute bony or soft tissue abnormality is seen. IMPRESSION: Status post left hip replacement Electronically Signed   By: Alcide Clever M.D.   On: 02/27/2016 15:58   Dg C-arm 61-120 Min-no Report  02/27/2016  CLINICAL DATA: hip C-ARM  61-120 MINUTES Fluoroscopy was utilized by the requesting physician.  No radiographic interpretation.   Dg Hip Operative Unilat W Or W/o Pelvis Left  02/27/2016  CLINICAL DATA:  Total left hip replacement. EXAM: OPERATIVE left HIP (WITH PELVIS IF PERFORMED) 1 VIEWS TECHNIQUE: Fluoroscopic spot image(s) were submitted for interpretation post-operatively. COMPARISON:  None. FINDINGS: Fluoroscopy shows a total hip arthroplasty. No evidence of periprosthetic fracture or dislocation. IMPRESSION: Fluoroscopy for total hip arthroplasty. Electronically Signed   By: Marnee Spring M.D.   On: 02/27/2016 14:56    Disposition: 01-Home or Self Care      Discharge Instructions    Call MD / Call 911    Complete by:  As directed   If you experience chest pain or shortness of breath, CALL 911 and be transported to the hospital emergency room.  If you develope a fever above 101 F, pus (white drainage) or increased drainage or redness at the wound, or calf pain, call your surgeon's office.     Constipation Prevention    Complete by:  As directed   Drink plenty of fluids.  Prune juice may be helpful.  You may use a stool softener, such as Colace (over the counter) 100 mg twice a day.  Use MiraLax (over the counter) for constipation as needed.     Diet - low sodium heart healthy    Complete by:  As directed      Driving restrictions    Complete by:  As directed   No driving for 6 weeks     Increase activity slowly as tolerated    Complete by:  As directed      Lifting restrictions    Complete by:  As directed   No lifting for 6 weeks     TED hose    Complete by:  As directed   Use stockings (TED hose) for 2 weeks on both leg(s).  You may remove them at night for sleeping.           Follow-up Information    Follow up with Moss Berry, Cloyde Reams, MD. Schedule an appointment as soon as possible for a visit in 2 weeks.   Specialty:  Orthopedic Surgery   Why:  For wound re-check   Contact information:    [address] [place] [address] [phone]  Signed: Garnet KoyanagiSwinteck, Harold Mattes James 02/28/2016, 7:36 AM

## 2020-01-29 HISTORY — PX: AORTA SURGERY: SHX548

## 2020-05-31 ENCOUNTER — Ambulatory Visit: Payer: Self-pay | Admitting: Student

## 2020-05-31 NOTE — Patient Instructions (Addendum)
DUE TO COVID-19 ONLY ONE VISITOR IS ALLOWED TO COME WITH YOU AND STAY IN THE WAITING ROOM ONLY DURING PRE OP AND PROCEDURE DAY OF SURGERY. THE 1 VISITOR  MAY VISIT WITH YOU AFTER SURGERY IN YOUR PRIVATE ROOM DURING VISITING HOURS ONLY!  YOU NEED TO HAVE A COVID 19 TEST ON: 06/03/20 , THIS TEST MUST BE DONE BEFORE SURGERY,  COVID TESTING SITE [address] [place] [address], IT IS ON THE RIGHT GOING OUT [address] APPROXIMATELY  2 MINUTES PAST [employer] ON THE RIGHT. ONCE YOUR COVID TEST IS COMPLETED,  PLEASE BEGIN THE QUARANTINE INSTRUCTIONS AS OUTLINED IN YOUR HANDOUT.                Phillip Thomas    Your procedure is scheduled on: 06/06/20   Report to [facility] Main  Entrance   Report to admitting at : 9:40 AM     Call this number if you have problems the morning of surgery [phone]    Remember:   NO SOLID FOOD AFTER MIDNIGHT THE NIGHT PRIOR TO SURGERY. NOTHING BY MOUTH EXCEPT CLEAR LIQUIDS UNTIL: 9:00 am . PLEASE FINISH ENSURE DRINK PER SURGEON ORDER  WHICH NEEDS TO BE COMPLETED AT: 9:00 am .  CLEAR LIQUID DIET   Foods Allowed                                                                     Foods Excluded  Coffee and tea, regular and decaf                             liquids that you cannot  Plain Jell-O any favor except red or purple                                           see through such as: Fruit ices (not with fruit pulp)                                     milk, soups, orange juice  Iced Popsicles                                    All solid food Carbonated beverages, regular and diet                                    Cranberry, grape and apple juices Sports drinks like Gatorade Lightly seasoned clear broth or consume(fat free) Sugar, honey syrup  Sample Menu Breakfast                                Lunch  Supper Cranberry juice                    Beef broth                            Chicken  broth Jell-O                                     Grape juice                           Apple juice Coffee or tea                        Jell-O                                      Popsicle                                                Coffee or tea                        Coffee or tea  _____________________________________________________________________   BRUSH YOUR TEETH MORNING OF SURGERY AND RINSE YOUR MOUTH OUT, NO CHEWING GUM CANDY OR MINTS.                                 You may not have any metal on your body including hair pins and              piercings  Do not wear jewelry, lotions, powders or perfumes, deodorant             Men may shave face and neck.   Do not bring valuables to the hospital. [facility] IS NOT             RESPONSIBLE   FOR VALUABLES.  Contacts, dentures or bridgework may not be worn into surgery.  Leave suitcase in the car. After surgery it may be brought to your room.     Patients discharged the day of surgery will not be allowed to drive home. IF YOU ARE HAVING SURGERY AND GOING HOME THE SAME DAY, YOU MUST HAVE AN ADULT TO DRIVE YOU HOME AND BE WITH YOU FOR 24 HOURS. YOU MAY GO HOME BY TAXI OR UBER OR ORTHERWISE, BUT AN ADULT MUST ACCOMPANY YOU HOME AND STAY WITH YOU FOR 24 HOURS.  Name and phone number of your driver:  Special Instructions: N/A              Please read over the following fact sheets you were given: _____________________________________________________________________          [facility] Before surgery, you can play an important role.  Because skin is not sterile, your skin needs to be as free of germs as possible.  You can reduce the number of germs on your skin by washing with CHG (chlorahexidine gluconate) soap before surgery.  CHG is an antiseptic cleaner which kills germs and  bonds with the skin to continue killing germs even after washing. Please DO NOT use if you have an allergy to CHG or antibacterial  soaps.  If your skin becomes reddened/irritated stop using the CHG and inform your nurse when you arrive at Short Stay. Do not shave (including legs and underarms) for at least 48 hours prior to the first CHG shower.  You may shave your face/neck. Please follow these instructions carefully:  1.  Shower with CHG Soap the night before surgery and the  morning of Surgery.  2.  If you choose to wash your hair, wash your hair first as usual with your  normal  shampoo.  3.  After you shampoo, rinse your hair and body thoroughly to remove the  shampoo.                           4.  Use CHG as you would any other liquid soap.  You can apply chg directly  to the skin and wash                       Gently with a scrungie or clean washcloth.  5.  Apply the CHG Soap to your body ONLY FROM THE NECK DOWN.   Do not use on face/ open                           Wound or open sores. Avoid contact with eyes, ears mouth and genitals (private parts).                       Wash face,  Genitals (private parts) with your normal soap.             6.  Wash thoroughly, paying special attention to the area where your surgery  will be performed.  7.  Thoroughly rinse your body with warm water from the neck down.  8.  DO NOT shower/wash with your normal soap after using and rinsing off  the CHG Soap.                9.  Pat yourself dry with a clean towel.            10.  Wear clean pajamas.            11.  Place clean sheets on your bed the night of your first shower and do not  sleep with pets. Day of Surgery : Do not apply any lotions/deodorants the morning of surgery.  Please wear clean clothes to the hospital/surgery center.  FAILURE TO FOLLOW THESE INSTRUCTIONS MAY RESULT IN THE CANCELLATION OF YOUR SURGERY PATIENT SIGNATURE_________________________________  NURSE SIGNATURE__________________________________  ________________________________________________________________________   Phillip Thomas  An  incentive spirometer is a tool that can help keep your lungs clear and active. This tool measures how well you are filling your lungs with each breath. Taking long deep breaths may help reverse or decrease the chance of developing breathing (pulmonary) problems (especially infection) following:  A long period of time when you are unable to move or be active. BEFORE THE PROCEDURE   If the spirometer includes an indicator to show your best effort, your nurse or respiratory therapist will set it to a desired goal.  If possible, sit up straight or lean slightly forward. Try not to slouch.  Hold the incentive spirometer in an upright position.  INSTRUCTIONS FOR USE  1. Sit on the edge of your bed if possible, or sit up as far as you can in bed or on a chair. 2. Hold the incentive spirometer in an upright position. 3. Breathe out normally. 4. Place the mouthpiece in your mouth and seal your lips tightly around it. 5. Breathe in slowly and as deeply as possible, raising the piston or the ball toward the top of the column. 6. Hold your breath for 3-5 seconds or for as long as possible. Allow the piston or ball to fall to the bottom of the column. 7. Remove the mouthpiece from your mouth and breathe out normally. 8. Rest for a few seconds and repeat Steps 1 through 7 at least 10 times every 1-2 hours when you are awake. Take your time and take a few normal breaths between deep breaths. 9. The spirometer may include an indicator to show your best effort. Use the indicator as a goal to work toward during each repetition. 10. After each set of 10 deep breaths, practice coughing to be sure your lungs are clear. If you have an incision (the cut made at the time of surgery), support your incision when coughing by placing a pillow or rolled up towels firmly against it. Once you are able to get out of bed, walk around indoors and cough well. You may stop using the incentive spirometer when instructed by your  caregiver.  RISKS AND COMPLICATIONS  Take your time so you do not get dizzy or light-headed.  If you are in pain, you may need to take or ask for pain medication before doing incentive spirometry. It is harder to take a deep breath if you are having pain. AFTER USE  Rest and breathe slowly and easily.  It can be helpful to keep track of a log of your progress. Your caregiver can provide you with a simple table to help with this. If you are using the spirometer at home, follow these instructions: SEEK MEDICAL CARE IF:   You are having difficultly using the spirometer.  You have trouble using the spirometer as often as instructed.  Your pain medication is not giving enough relief while using the spirometer.  You develop fever of 100.5 F (38.1 C) or higher. SEEK IMMEDIATE MEDICAL CARE IF:   You cough up bloody sputum that had not been present before.  You develop fever of 102 F (38.9 C) or greater.  You develop worsening pain at or near the incision site. MAKE SURE YOU:   Understand these instructions.  Will watch your condition.  Will get help right away if you are not doing well or get worse. Document Released: 02/15/2007 Document Revised: 12/28/2011 Document Reviewed: 04/18/2007 [facility] Patient Information 2014 [place], [place].   ________________________________________________________________________

## 2020-05-31 NOTE — H&P (Signed)
TOTAL HIP ADMISSION H&P  Patient is admitted for right total hip arthroplasty.  Subjective:  Chief Complaint: right hip pain  HPI: Phillip Thomas, 59 y.o. male, has a history of pain and functional disability in the right hip(s) due to arthritis and patient has failed non-surgical conservative treatments for greater than 12 weeks to include NSAID's and/or analgesics, weight reduction as appropriate and activity modification.  Onset of symptoms was gradual starting 4 years ago with gradually worsening course since that time.The patient noted no past surgery on the right hip(s).  Patient currently rates pain in the right hip at 8 out of 10 with activity. Patient has worsening of pain with activity and weight bearing, pain that interfers with activities of daily living and pain with passive range of motion. Patient has evidence of subchondral sclerosis and joint space narrowing by imaging studies. This condition presents safety issues increasing the risk of falls.There is no current active infection.  Patient Active Problem List   Diagnosis Date Noted   Primary osteoarthritis of left hip 02/27/2016   Past Medical History:  Diagnosis Date   Abdominal aortic aneurysm (AAA) without rupture (HCC)    Anxiety    Arthritis    oa   Depression    Hypertension    off all bp meds for last year and 1/2    PTSD (post-traumatic stress disorder)     Past Surgical History:  Procedure Laterality Date   APPENDECTOMY  as child   HERNIA REPAIR     x 2 single, 1 double   TOTAL HIP ARTHROPLASTY Left 02/27/2016   Procedure: LEFT TOTAL HIP ARTHROPLASTY ANTERIOR APPROACH;  Surgeon: Samson Frederic, MD;  Location: [facility];  Service: Orthopedics;  Laterality: Left;   WISDOM TOOTH EXTRACTION  2010    Current Outpatient Medications  Medication Sig Dispense Refill Last Dose   Ascorbic Acid (VITA-C PO) Take 1 tablet by mouth daily.      aspirin EC 81 MG EC tablet Take 1 tablet (81 mg total) by mouth 2 (two)  times daily after a meal. 60 tablet 1    Cholecalciferol (VITAMIN D3 PO) Take 1 tablet by mouth daily.      ibuprofen (ADVIL,MOTRIN) 200 MG tablet Take 800 mg by mouth every 6 (six) hours as needed (For pain.).      lisinopril (ZESTRIL) 20 MG tablet Take 20 mg by mouth daily.      Multiple Vitamins-Minerals (ZINC PO) Take 1 tablet by mouth daily.      No current facility-administered medications for this visit.   No Known Allergies  Social History   Tobacco Use   Smoking status: Current Every Day Smoker    Packs/day: 0.50    Years: 30.00    Pack years: 15.00    Types: Cigarettes   Smokeless tobacco: Never Used  Substance Use Topics   Alcohol use: Yes    Alcohol/week: 4.0 standard drinks    Types: 4 Cans of beer per week    Comment: 4 cans a day, 3x a week.    No family history on file.   Review of Systems  Constitutional: Negative.   HENT: Negative.   Eyes: Negative.   Respiratory: Negative.   Cardiovascular: Negative.   Gastrointestinal: Negative.   Endocrine: Negative.   Genitourinary: Negative.   Musculoskeletal: Positive for arthralgias.  Skin: Negative.   Allergic/Immunologic: Negative.   Neurological: Negative.   Hematological: Negative.   Psychiatric/Behavioral: Negative.     Objective:  Physical Exam Constitutional:  Appearance: Normal appearance.  HENT:     Head: Normocephalic and atraumatic.     Right Ear: External ear normal.     Left Ear: External ear normal.     Nose: Nose normal.  Eyes:     Extraocular Movements: Extraocular movements intact.     Pupils: Pupils are equal, round, and reactive to light.  Cardiovascular:     Rate and Rhythm: Normal rate.     Heart sounds: Normal heart sounds. No murmur heard.  No friction rub. No gallop.   Pulmonary:     Effort: Pulmonary effort is normal.     Breath sounds: Normal breath sounds.  Abdominal:     Palpations: Abdomen is soft.     Tenderness: There is no abdominal tenderness.   Genitourinary:    Comments: Deferred Musculoskeletal:     Cervical back: Normal range of motion.     Comments: Examination left hips reveals no skin wounds or lesions. He does not have trochanteric tenderness to palpation of the left hip. No pain with passive range of motion. No pain in the position of impingement.  Examination right hips reveals no skin wounds or lesions. Hedoes not have trochanteric tenderness to palpation of the right hip. severe pain with passive range of motion. Pain in the position of impingement. Flexion contracture of 15 degrees.  Skin:    General: Skin is warm and dry.  Neurological:     Mental Status: He is alert and oriented to person, place, and time.  Psychiatric:        Mood and Affect: Mood normal.     Vital signs in last 24 hours: @VSRANGES @  Labs:   Estimated body mass index is 33.9 kg/m as calculated from the following:   Height as of 02/27/16: 6\' 2"  (1.88 m).   Weight as of 02/27/16: 119.7 kg.   Imaging Review Plain radiographs demonstrate severe degenerative joint disease of the right hip(s). The bone quality appears to be adequate for age and reported activity level.      Assessment/Plan:  End stage arthritis, right hip(s)  The patient history, physical examination, clinical judgement of the provider and imaging studies are consistent with end stage degenerative joint disease of the right hip(s) and total hip arthroplasty is deemed medically necessary. The treatment options including medical management, injection therapy, arthroscopy and arthroplasty were discussed at length. The risks and benefits of total hip arthroplasty were presented and reviewed. The risks due to aseptic loosening, infection, stiffness, dislocation/subluxation,  thromboembolic complications and other imponderables were discussed.  The patient acknowledged the explanation, agreed to proceed with the plan and consent was signed. Patient is being admitted for inpatient  treatment for surgery, pain control, PT, OT, prophylactic antibiotics, VTE prophylaxis, progressive ambulation and ADL's and discharge planning.The patient is planning to be discharged home where he will complete a home exercise plan

## 2020-05-31 NOTE — H&P (View-Only) (Signed)
TOTAL HIP ADMISSION H&P  Patient is admitted for right total hip arthroplasty.  Subjective:  Chief Complaint: right hip pain  HPI: Phillip Thomas, 59 y.o. male, has a history of pain and functional disability in the right hip(s) due to arthritis and patient has failed non-surgical conservative treatments for greater than 12 weeks to include NSAID's and/or analgesics, weight reduction as appropriate and activity modification.  Onset of symptoms was gradual starting 4 years ago with gradually worsening course since that time.The patient noted no past surgery on the right hip(s).  Patient currently rates pain in the right hip at 8 out of 10 with activity. Patient has worsening of pain with activity and weight bearing, pain that interfers with activities of daily living and pain with passive range of motion. Patient has evidence of subchondral sclerosis and joint space narrowing by imaging studies. This condition presents safety issues increasing the risk of falls.There is no current active infection.  Patient Active Problem List   Diagnosis Date Noted   Primary osteoarthritis of left hip 02/27/2016   Past Medical History:  Diagnosis Date   Abdominal aortic aneurysm (AAA) without rupture (HCC)    Anxiety    Arthritis    oa   Depression    Hypertension    off all bp meds for last year and 1/2    PTSD (post-traumatic stress disorder)     Past Surgical History:  Procedure Laterality Date   APPENDECTOMY  as child   HERNIA REPAIR     x 2 single, 1 double   TOTAL HIP ARTHROPLASTY Left 02/27/2016   Procedure: LEFT TOTAL HIP ARTHROPLASTY ANTERIOR APPROACH;  Surgeon: Samson Frederic, MD;  Location: [facility];  Service: Orthopedics;  Laterality: Left;   WISDOM TOOTH EXTRACTION  2010    Current Outpatient Medications  Medication Sig Dispense Refill Last Dose   Ascorbic Acid (VITA-C PO) Take 1 tablet by mouth daily.      aspirin EC 81 MG EC tablet Take 1 tablet (81 mg total) by mouth 2 (two)  times daily after a meal. 60 tablet 1    Cholecalciferol (VITAMIN D3 PO) Take 1 tablet by mouth daily.      ibuprofen (ADVIL,MOTRIN) 200 MG tablet Take 800 mg by mouth every 6 (six) hours as needed (For pain.).      lisinopril (ZESTRIL) 20 MG tablet Take 20 mg by mouth daily.      Multiple Vitamins-Minerals (ZINC PO) Take 1 tablet by mouth daily.      No current facility-administered medications for this visit.   No Known Allergies  Social History   Tobacco Use   Smoking status: Current Every Day Smoker    Packs/day: 0.50    Years: 30.00    Pack years: 15.00    Types: Cigarettes   Smokeless tobacco: Never Used  Substance Use Topics   Alcohol use: Yes    Alcohol/week: 4.0 standard drinks    Types: 4 Cans of beer per week    Comment: 4 cans a day, 3x a week.    No family history on file.   Review of Systems  Constitutional: Negative.   HENT: Negative.   Eyes: Negative.   Respiratory: Negative.   Cardiovascular: Negative.   Gastrointestinal: Negative.   Endocrine: Negative.   Genitourinary: Negative.   Musculoskeletal: Positive for arthralgias.  Skin: Negative.   Allergic/Immunologic: Negative.   Neurological: Negative.   Hematological: Negative.   Psychiatric/Behavioral: Negative.     Objective:  Physical Exam Constitutional:  Appearance: Normal appearance.  HENT:     Head: Normocephalic and atraumatic.     Right Ear: External ear normal.     Left Ear: External ear normal.     Nose: Nose normal.  Eyes:     Extraocular Movements: Extraocular movements intact.     Pupils: Pupils are equal, round, and reactive to light.  Cardiovascular:     Rate and Rhythm: Normal rate.     Heart sounds: Normal heart sounds. No murmur heard.  No friction rub. No gallop.   Pulmonary:     Effort: Pulmonary effort is normal.     Breath sounds: Normal breath sounds.  Abdominal:     Palpations: Abdomen is soft.     Tenderness: There is no abdominal tenderness.   Genitourinary:    Comments: Deferred Musculoskeletal:     Cervical back: Normal range of motion.     Comments: Examination left hips reveals no skin wounds or lesions. He does not have trochanteric tenderness to palpation of the left hip. No pain with passive range of motion. No pain in the position of impingement.  Examination right hips reveals no skin wounds or lesions. Hedoes not have trochanteric tenderness to palpation of the right hip. severe pain with passive range of motion. Pain in the position of impingement. Flexion contracture of 15 degrees.  Skin:    General: Skin is warm and dry.  Neurological:     Mental Status: He is alert and oriented to person, place, and time.  Psychiatric:        Mood and Affect: Mood normal.     Vital signs in last 24 hours: @VSRANGES @  Labs:   Estimated body mass index is 33.9 kg/m as calculated from the following:   Height as of 02/27/16: 6\' 2"  (1.88 m).   Weight as of 02/27/16: 119.7 kg.   Imaging Review Plain radiographs demonstrate severe degenerative joint disease of the right hip(s). The bone quality appears to be adequate for age and reported activity level.      Assessment/Plan:  End stage arthritis, right hip(s)  The patient history, physical examination, clinical judgement of the provider and imaging studies are consistent with end stage degenerative joint disease of the right hip(s) and total hip arthroplasty is deemed medically necessary. The treatment options including medical management, injection therapy, arthroscopy and arthroplasty were discussed at length. The risks and benefits of total hip arthroplasty were presented and reviewed. The risks due to aseptic loosening, infection, stiffness, dislocation/subluxation,  thromboembolic complications and other imponderables were discussed.  The patient acknowledged the explanation, agreed to proceed with the plan and consent was signed. Patient is being admitted for inpatient  treatment for surgery, pain control, PT, OT, prophylactic antibiotics, VTE prophylaxis, progressive ambulation and ADL's and discharge planning.The patient is planning to be discharged home where he will complete a home exercise plan

## 2020-06-03 ENCOUNTER — Encounter (HOSPITAL_COMMUNITY): Payer: Self-pay

## 2020-06-03 ENCOUNTER — Other Ambulatory Visit (HOSPITAL_COMMUNITY)
Admission: RE | Admit: 2020-06-03 | Discharge: 2020-06-03 | Disposition: A | Discharge status: Home / Self Care | Payer: Medicare PPO | Source: Ambulatory Visit | Attending: Orthopedic Surgery | Admitting: Orthopedic Surgery

## 2020-06-03 ENCOUNTER — Encounter (HOSPITAL_COMMUNITY)
Admission: RE | Admit: 2020-06-03 | Discharge: 2020-06-03 | Disposition: A | Discharge status: Home / Self Care | Payer: Medicare PPO | Source: Ambulatory Visit | Attending: Orthopedic Surgery | Admitting: Orthopedic Surgery

## 2020-06-03 ENCOUNTER — Other Ambulatory Visit: Payer: Self-pay

## 2020-06-03 DIAGNOSIS — Z01812 Encounter for preprocedural laboratory examination: Secondary | ICD-10-CM | POA: Diagnosis not present

## 2020-06-03 DIAGNOSIS — F4310 Post-traumatic stress disorder, unspecified: Secondary | ICD-10-CM | POA: Diagnosis not present

## 2020-06-03 DIAGNOSIS — I10 Essential (primary) hypertension: Secondary | ICD-10-CM | POA: Insufficient documentation

## 2020-06-03 DIAGNOSIS — F17210 Nicotine dependence, cigarettes, uncomplicated: Secondary | ICD-10-CM | POA: Insufficient documentation

## 2020-06-03 DIAGNOSIS — Z79899 Other long term (current) drug therapy: Secondary | ICD-10-CM | POA: Diagnosis not present

## 2020-06-03 DIAGNOSIS — R9431 Abnormal electrocardiogram [ECG] [EKG]: Secondary | ICD-10-CM | POA: Insufficient documentation

## 2020-06-03 DIAGNOSIS — Z01810 Encounter for preprocedural cardiovascular examination: Secondary | ICD-10-CM | POA: Insufficient documentation

## 2020-06-03 DIAGNOSIS — Z20822 Contact with and (suspected) exposure to covid-19: Secondary | ICD-10-CM | POA: Insufficient documentation

## 2020-06-03 DIAGNOSIS — M1611 Unilateral primary osteoarthritis, right hip: Secondary | ICD-10-CM | POA: Insufficient documentation

## 2020-06-03 DIAGNOSIS — F329 Major depressive disorder, single episode, unspecified: Secondary | ICD-10-CM | POA: Insufficient documentation

## 2020-06-03 DIAGNOSIS — Z7982 Long term (current) use of aspirin: Secondary | ICD-10-CM | POA: Diagnosis not present

## 2020-06-03 LAB — CBC
HCT: 44.9 % (ref 39.0–52.0)
Hemoglobin: 15.6 g/dL (ref 13.0–17.0)
MCH: 34.1 pg — ABNORMAL HIGH (ref 26.0–34.0)
MCHC: 34.7 g/dL (ref 30.0–36.0)
MCV: 98 fL (ref 80.0–100.0)
Platelets: 199 K/uL (ref 150–400)
RBC: 4.58 MIL/uL (ref 4.22–5.81)
RDW: 13.2 % (ref 11.5–15.5)
WBC: 6.9 K/uL (ref 4.0–10.5)
nRBC: 0 % (ref 0.0–0.2)

## 2020-06-03 LAB — PROTIME-INR
INR: 1 (ref 0.8–1.2)
Prothrombin Time: 13.1 s (ref 11.4–15.2)

## 2020-06-03 LAB — SARS CORONAVIRUS 2 (TAT 6-24 HRS): SARS Coronavirus 2: NEGATIVE

## 2020-06-03 LAB — URINALYSIS, ROUTINE W REFLEX MICROSCOPIC
Bilirubin Urine: NEGATIVE
Glucose, UA: NEGATIVE mg/dL
Hgb urine dipstick: NEGATIVE
Ketones, ur: NEGATIVE mg/dL
Leukocytes,Ua: NEGATIVE
Nitrite: NEGATIVE
Protein, ur: NEGATIVE mg/dL
Specific Gravity, Urine: 1.012 (ref 1.005–1.030)
pH: 6 (ref 5.0–8.0)

## 2020-06-03 LAB — COMPREHENSIVE METABOLIC PANEL
ALT: 108 U/L — ABNORMAL HIGH (ref 0–44)
AST: 55 U/L — ABNORMAL HIGH (ref 15–41)
Albumin: 4.7 g/dL (ref 3.5–5.0)
Alkaline Phosphatase: 70 U/L (ref 38–126)
Anion gap: 10 (ref 5–15)
BUN: 13 mg/dL (ref 6–20)
CO2: 26 mmol/L (ref 22–32)
Calcium: 9.7 mg/dL (ref 8.9–10.3)
Chloride: 101 mmol/L (ref 98–111)
Creatinine, Ser: 0.83 mg/dL (ref 0.61–1.24)
GFR calc Af Amer: >60 mL/min (ref >60)
GFR calc non Af Amer: >60 mL/min (ref >60)
Glucose, Bld: 108 mg/dL — ABNORMAL HIGH (ref 70–99)
Potassium: 4.6 mmol/L (ref 3.5–5.1)
Sodium: 137 mmol/L (ref 135–145)
Total Bilirubin: 0.7 mg/dL (ref 0.3–1.2)
Total Protein: 8 g/dL (ref 6.5–8.1)

## 2020-06-03 LAB — SURGICAL PCR SCREEN
MRSA, PCR: NEGATIVE
Staphylococcus aureus: NEGATIVE

## 2020-06-03 NOTE — Progress Notes (Signed)
COVID Vaccine Completed:NO Date COVID Vaccine completed: COVID vaccine manufacturer: [employer]   PCP - Dr. Lowella Fairy. Cardiologist - Dr. Hayden Pedro.  Chest x-ray -  EKG -  Stress Test -  ECHO -  Cardiac Cath -   Sleep Study -  CPAP -   Fasting Blood Sugar -  Checks Blood Sugar _____ times a day  Blood Thinner Instructions: Aspirin Instructions: On hold since 05/31/20 Last Dose:  Anesthesia review: Hx: AAA,HTN  Patient denies shortness of breath, fever, cough and chest pain at PAT appointment   Patient verbalized understanding of instructions that were given to them at the PAT appointment. Patient was also instructed that they will need to review over the PAT instructions again at home before surgery.

## 2020-06-04 NOTE — Progress Notes (Signed)
Anesthesia Chart Review   Case: [id number] Date/Time: 06/06/20 1155   Procedure: TOTAL HIP ARTHROPLASTY ANTERIOR APPROACH (Right Hip)   Anesthesia type: Spinal   Pre-op diagnosis: Right hip osteoarthritis   Location: [facility] 09 / [facility]   Surgeons: Samson Frederic, MD      DISCUSSION:59 y.o. current every days smoker (15 pack years) with h/o HTN, PTSD, AAA s/p repair 01/2020, right hip OA scheduled for above procedure 06/06/2020 with Dr. Samson Frederic.   Pt s/p AAA repair 01/2020.  Cleared by vascular surgeon, Dr. Alesia Richards.      Clearance received from PCP.  Per OV notes no h/o "anesthesia complications; able to walk up stairs; able to perform heavy work around with house; no difficulty walking up hills; able to walk 4 mph.  He had AAA repair in April.  Recovery has been uneventful and has followed up with Dr. Alesia Richards."  Anticipate pt can proceed with planned procedure barring acute status change.   VS: BP (!) 157/82   Pulse 84   Temp 36.8 C (Oral)   Resp 18   Ht 6\' 2"  (1.88 m)   Wt 130.4 kg   SpO2 98%   BMI 36.91 kg/m   PROVIDERS: System, Pcp Not In  , MD is vascular surgeon  LABS: Labs reviewed: Acceptable for surgery. (all labs ordered are listed, but only abnormal results are displayed)  Labs Reviewed  CBC - Abnormal; Notable for the following components:      Result Value   MCH 34.1 (*)    All other components within normal limits  COMPREHENSIVE METABOLIC PANEL - Abnormal; Notable for the following components:   Glucose, Bld 108 (*)    AST 55 (*)    ALT 108 (*)    All other components within normal limits  SURGICAL PCR SCREEN  PROTIME-INR  URINALYSIS, ROUTINE W REFLEX MICROSCOPIC  TYPE AND SCREEN     IMAGES:   EKG: 06/03/2020 Rate 74 bpm  NSR   CV:  Past Medical History:  Diagnosis Date  . Abdominal aortic aneurysm (AAA) without rupture (HCC)   . Anxiety   . Arthritis    oa  . Depression   . Hypertension    off all bp meds for last  year and 1/2   . PTSD (post-traumatic stress disorder)     Past Surgical History:  Procedure Laterality Date  . AORTA SURGERY  01/29/2020  . APPENDECTOMY  as child  . HERNIA REPAIR     x 2 single, 1 double  . TOTAL HIP ARTHROPLASTY Left 02/27/2016   Procedure: LEFT TOTAL HIP ARTHROPLASTY ANTERIOR APPROACH;  Surgeon: 04/28/2016, MD;  Location: [facility];  Service: Orthopedics;  Laterality: Left;  . WISDOM TOOTH EXTRACTION  2010    MEDICATIONS: . Ascorbic Acid (VITA-C PO)  . aspirin EC 81 MG EC tablet  . Cholecalciferol (VITAMIN D3 PO)  . ibuprofen (ADVIL,MOTRIN) 200 MG tablet  . lisinopril (ZESTRIL) 20 MG tablet  . Multiple Vitamins-Minerals (ZINC PO)   No current facility-administered medications for this encounter.    2011, PA-C [facility] [phone]

## 2020-06-04 NOTE — Anesthesia Preprocedure Evaluation (Addendum)
Anesthesia Evaluation  Patient identified by MRN, date of birth, ID band Patient awake    Reviewed: Allergy & Precautions, NPO status , Patient's Chart, lab work & pertinent test results  History of Anesthesia Complications Negative for: history of anesthetic complications  Airway Mallampati: II  TM Distance: >3 FB Neck ROM: Full    Dental  (+) Dental Advisory Given, Partial Upper   Pulmonary Current Smoker,    Pulmonary exam normal        Cardiovascular hypertension, Pt. on medications + Peripheral Vascular Disease  Normal cardiovascular exam  S/P AAA repair   Neuro/Psych PSYCHIATRIC DISORDERS Anxiety Depression negative neurological ROS     GI/Hepatic negative GI ROS, Neg liver ROS,   Endo/Other  negative endocrine ROS  Renal/GU negative Renal ROS     Musculoskeletal negative musculoskeletal ROS (+)   Abdominal   Peds  Hematology negative hematology ROS (+)   Anesthesia Other Findings   Reproductive/Obstetrics                          Anesthesia Physical Anesthesia Plan  ASA: II  Anesthesia Plan: General   Post-op Pain Management:    Induction: Intravenous  PONV Risk Score and Plan: 2 and Ondansetron, Dexamethasone and Midazolam  Airway Management Planned: Natural Airway  Additional Equipment:   Intra-op Plan:   Post-operative Plan: Extubation in OR  Informed Consent: I have reviewed the patients History and Physical, chart, labs and discussed the procedure including the risks, benefits and alternatives for the proposed anesthesia with the patient or authorized representative who has indicated his/her understanding and acceptance.     Dental advisory given  Plan Discussed with: Anesthesiologist and CRNA  Anesthesia Plan Comments: (See PAT note 06/03/2020, Jodell Cipro, PA-C)      Anesthesia Quick Evaluation

## 2020-06-05 MED ORDER — DEXTROSE 5 % IV SOLN
3.0000 g | INTRAVENOUS | Status: AC
  Administered 2020-06-06: 3 g via INTRAVENOUS
  Filled 2020-06-05: qty 3

## 2020-06-06 ENCOUNTER — Ambulatory Visit (HOSPITAL_COMMUNITY): Payer: Medicare PPO | Admitting: Physician Assistant

## 2020-06-06 ENCOUNTER — Other Ambulatory Visit: Payer: Self-pay

## 2020-06-06 ENCOUNTER — Ambulatory Visit (HOSPITAL_COMMUNITY): Payer: Medicare PPO

## 2020-06-06 ENCOUNTER — Encounter (HOSPITAL_COMMUNITY): Payer: Self-pay | Admitting: Orthopedic Surgery

## 2020-06-06 ENCOUNTER — Ambulatory Visit (HOSPITAL_COMMUNITY): Payer: Medicare PPO | Admitting: Anesthesiology

## 2020-06-06 ENCOUNTER — Observation Stay (HOSPITAL_COMMUNITY)
Admission: RE | Admit: 2020-06-06 | Discharge: 2020-06-07 | Disposition: A | Discharge status: Home / Self Care | Payer: Medicare PPO | Source: Ambulatory Visit | Attending: Orthopedic Surgery | Admitting: Orthopedic Surgery

## 2020-06-06 ENCOUNTER — Encounter (HOSPITAL_COMMUNITY)
Admission: RE | Disposition: A | Discharge status: Home / Self Care | Payer: Self-pay | Source: Ambulatory Visit | Attending: Orthopedic Surgery

## 2020-06-06 DIAGNOSIS — M1611 Unilateral primary osteoarthritis, right hip: Secondary | ICD-10-CM | POA: Diagnosis not present

## 2020-06-06 DIAGNOSIS — I10 Essential (primary) hypertension: Secondary | ICD-10-CM | POA: Diagnosis not present

## 2020-06-06 DIAGNOSIS — Z09 Encounter for follow-up examination after completed treatment for conditions other than malignant neoplasm: Secondary | ICD-10-CM

## 2020-06-06 DIAGNOSIS — Z7982 Long term (current) use of aspirin: Secondary | ICD-10-CM | POA: Diagnosis not present

## 2020-06-06 DIAGNOSIS — F17210 Nicotine dependence, cigarettes, uncomplicated: Secondary | ICD-10-CM | POA: Insufficient documentation

## 2020-06-06 DIAGNOSIS — Z96652 Presence of left artificial knee joint: Secondary | ICD-10-CM | POA: Diagnosis not present

## 2020-06-06 HISTORY — PX: TOTAL HIP ARTHROPLASTY: SHX124

## 2020-06-06 LAB — TYPE AND SCREEN
ABO/RH(D): A NEG
Antibody Screen: NEGATIVE

## 2020-06-06 SURGERY — ARTHROPLASTY, HIP, TOTAL, ANTERIOR APPROACH
Anesthesia: General | Site: Hip | Laterality: Right

## 2020-06-06 MED ORDER — MENTHOL 3 MG MT LOZG: 1.0000 | LOZENGE | OROMUCOSAL | Status: DC | PRN

## 2020-06-06 MED ORDER — POVIDONE-IODINE 10 % EX SWAB: 2.0000 "application " | Freq: Once | CUTANEOUS | Status: DC

## 2020-06-06 MED ORDER — HYDROCODONE-ACETAMINOPHEN 5-325 MG PO TABS
1.0000 | ORAL_TABLET | ORAL | Status: DC | PRN
  Administered 2020-06-07 (×2): 2 via ORAL
  Filled 2020-06-06 (×2): qty 2

## 2020-06-06 MED ORDER — ONDANSETRON HCL 4 MG/2ML IJ SOLN
INTRAMUSCULAR | Status: AC
  Filled 2020-06-06: qty 4

## 2020-06-06 MED ORDER — PROPOFOL 10 MG/ML IV BOLUS
INTRAVENOUS | Status: AC
  Filled 2020-06-06: qty 20

## 2020-06-06 MED ORDER — ISOPROPYL ALCOHOL 70 % SOLN
Status: DC | PRN
  Administered 2020-06-06: 1 via TOPICAL

## 2020-06-06 MED ORDER — ORAL CARE MOUTH RINSE: 15.0000 mL | Freq: Once | OROMUCOSAL | Status: AC

## 2020-06-06 MED ORDER — SODIUM CHLORIDE 0.9 % IV SOLN: INTRAVENOUS | Status: DC

## 2020-06-06 MED ORDER — HYDROCODONE-ACETAMINOPHEN 7.5-325 MG PO TABS: 1.0000 | ORAL_TABLET | ORAL | Status: DC | PRN

## 2020-06-06 MED ORDER — FENTANYL CITRATE (PF) 100 MCG/2ML IJ SOLN
INTRAMUSCULAR | Status: DC | PRN
  Administered 2020-06-06 (×5): 50 ug via INTRAVENOUS
  Administered 2020-06-06: 25 ug via INTRAVENOUS
  Administered 2020-06-06: 50 ug via INTRAVENOUS
  Administered 2020-06-06: 25 ug via INTRAVENOUS

## 2020-06-06 MED ORDER — LACTATED RINGERS IV SOLN: INTRAVENOUS | Status: DC

## 2020-06-06 MED ORDER — ACETAMINOPHEN 10 MG/ML IV SOLN
1000.0000 mg | Freq: Once | INTRAVENOUS | Status: AC
  Administered 2020-06-06: 1000 mg via INTRAVENOUS
  Filled 2020-06-06: qty 100

## 2020-06-06 MED ORDER — ASPIRIN 81 MG PO CHEW
81.0000 mg | CHEWABLE_TABLET | Freq: Two times a day (BID) | ORAL | Status: DC
  Administered 2020-06-06 – 2020-06-07 (×2): 81 mg via ORAL
  Filled 2020-06-06 (×2): qty 1

## 2020-06-06 MED ORDER — WATER FOR IRRIGATION, STERILE IR SOLN
Status: DC | PRN
  Administered 2020-06-06: 2000 mL

## 2020-06-06 MED ORDER — PHENYLEPHRINE 40 MCG/ML (10ML) SYRINGE FOR IV PUSH (FOR BLOOD PRESSURE SUPPORT)
PREFILLED_SYRINGE | INTRAVENOUS | Status: AC
  Filled 2020-06-06: qty 10

## 2020-06-06 MED ORDER — METOCLOPRAMIDE HCL 5 MG PO TABS: 5.0000 mg | ORAL_TABLET | Freq: Three times a day (TID) | ORAL | Status: DC | PRN

## 2020-06-06 MED ORDER — ROCURONIUM BROMIDE 10 MG/ML (PF) SYRINGE
PREFILLED_SYRINGE | INTRAVENOUS | Status: DC | PRN
  Administered 2020-06-06: 25 mg via INTRAVENOUS
  Administered 2020-06-06: 50 mg via INTRAVENOUS
  Administered 2020-06-06: 25 mg via INTRAVENOUS

## 2020-06-06 MED ORDER — ONDANSETRON HCL 4 MG/2ML IJ SOLN: 4.0000 mg | Freq: Four times a day (QID) | INTRAMUSCULAR | Status: DC | PRN

## 2020-06-06 MED ORDER — KETOROLAC TROMETHAMINE 15 MG/ML IJ SOLN
15.0000 mg | Freq: Four times a day (QID) | INTRAMUSCULAR | Status: DC
  Administered 2020-06-07 (×2): 15 mg via INTRAVENOUS
  Filled 2020-06-06 (×2): qty 1

## 2020-06-06 MED ORDER — FENTANYL CITRATE (PF) 250 MCG/5ML IJ SOLN
INTRAMUSCULAR | Status: AC
  Filled 2020-06-06: qty 5

## 2020-06-06 MED ORDER — METHOCARBAMOL 500 MG IVPB - SIMPLE MED
500.0000 mg | Freq: Four times a day (QID) | INTRAVENOUS | Status: DC | PRN
  Filled 2020-06-06: qty 50

## 2020-06-06 MED ORDER — PHENOL 1.4 % MT LIQD: 1.0000 | OROMUCOSAL | Status: DC | PRN

## 2020-06-06 MED ORDER — CELECOXIB 200 MG PO CAPS
200.0000 mg | ORAL_CAPSULE | Freq: Once | ORAL | Status: AC
  Administered 2020-06-06: 200 mg via ORAL
  Filled 2020-06-06: qty 1

## 2020-06-06 MED ORDER — MIDAZOLAM HCL 5 MG/5ML IJ SOLN
INTRAMUSCULAR | Status: DC | PRN
  Administered 2020-06-06: 2 mg via INTRAVENOUS

## 2020-06-06 MED ORDER — SODIUM CHLORIDE 0.9 % IR SOLN
Status: DC | PRN
  Administered 2020-06-06: 3000 mL

## 2020-06-06 MED ORDER — LACTATED RINGERS IV BOLUS: 250.0000 mL | Freq: Once | INTRAVENOUS | Status: DC

## 2020-06-06 MED ORDER — ASPIRIN 81 MG PO CHEW: 81.0000 mg | CHEWABLE_TABLET | Freq: Two times a day (BID) | ORAL | 0 refills | Status: AC

## 2020-06-06 MED ORDER — METHOCARBAMOL 500 MG PO TABS
500.0000 mg | ORAL_TABLET | Freq: Four times a day (QID) | ORAL | Status: DC | PRN
  Administered 2020-06-07: 500 mg via ORAL
  Filled 2020-06-06: qty 1

## 2020-06-06 MED ORDER — SUGAMMADEX SODIUM 200 MG/2ML IV SOLN
INTRAVENOUS | Status: DC | PRN
  Administered 2020-06-06: 350 mg via INTRAVENOUS

## 2020-06-06 MED ORDER — HYDROMORPHONE HCL 1 MG/ML IJ SOLN
INTRAMUSCULAR | Status: DC | PRN
Start: 2020-06-06 — End: 2020-06-06
  Administered 2020-06-06 (×2): 1 mg via INTRAVENOUS

## 2020-06-06 MED ORDER — TRANEXAMIC ACID-NACL 1000-0.7 MG/100ML-% IV SOLN
1000.0000 mg | INTRAVENOUS | Status: AC
  Administered 2020-06-06: 1000 mg via INTRAVENOUS
  Filled 2020-06-06: qty 100

## 2020-06-06 MED ORDER — ALUM & MAG HYDROXIDE-SIMETH 200-200-20 MG/5ML PO SUSP: 30.0000 mL | ORAL | Status: DC | PRN

## 2020-06-06 MED ORDER — CHLORHEXIDINE GLUCONATE 0.12 % MT SOLN
15.0000 mL | Freq: Once | OROMUCOSAL | Status: AC
  Administered 2020-06-06: 15 mL via OROMUCOSAL

## 2020-06-06 MED ORDER — ONDANSETRON HCL 4 MG PO TABS: 4.0000 mg | ORAL_TABLET | Freq: Three times a day (TID) | ORAL | 0 refills | Status: AC | PRN

## 2020-06-06 MED ORDER — ACETAMINOPHEN 500 MG PO TABS: 1000.0000 mg | ORAL_TABLET | Freq: Once | ORAL | Status: DC

## 2020-06-06 MED ORDER — METOCLOPRAMIDE HCL 5 MG/ML IJ SOLN: 5.0000 mg | Freq: Three times a day (TID) | INTRAMUSCULAR | Status: DC | PRN

## 2020-06-06 MED ORDER — DOCUSATE SODIUM 100 MG PO CAPS
100.0000 mg | ORAL_CAPSULE | Freq: Two times a day (BID) | ORAL | Status: DC
  Administered 2020-06-06 – 2020-06-07 (×2): 100 mg via ORAL
  Filled 2020-06-06 (×2): qty 1

## 2020-06-06 MED ORDER — PHENYLEPHRINE 40 MCG/ML (10ML) SYRINGE FOR IV PUSH (FOR BLOOD PRESSURE SUPPORT)
PREFILLED_SYRINGE | INTRAVENOUS | Status: DC | PRN
  Administered 2020-06-06: 80 ug via INTRAVENOUS

## 2020-06-06 MED ORDER — SENNA 8.6 MG PO TABS: 2.0000 | ORAL_TABLET | Freq: Every day | ORAL | 1 refills | Status: AC

## 2020-06-06 MED ORDER — FENTANYL CITRATE (PF) 100 MCG/2ML IJ SOLN: 25.0000 ug | INTRAMUSCULAR | Status: DC | PRN

## 2020-06-06 MED ORDER — HYDROCODONE-ACETAMINOPHEN 5-325 MG PO TABS
1.0000 | ORAL_TABLET | ORAL | 0 refills | Status: DC | PRN
Start: 2020-06-06 — End: 2021-02-24

## 2020-06-06 MED ORDER — ONDANSETRON HCL 4 MG/2ML IJ SOLN
INTRAMUSCULAR | Status: DC | PRN
  Administered 2020-06-06: 4 mg via INTRAVENOUS

## 2020-06-06 MED ORDER — DEXAMETHASONE SODIUM PHOSPHATE 10 MG/ML IJ SOLN
INTRAMUSCULAR | Status: AC
  Filled 2020-06-06: qty 1

## 2020-06-06 MED ORDER — EPHEDRINE 5 MG/ML INJ
INTRAVENOUS | Status: AC
  Filled 2020-06-06: qty 10

## 2020-06-06 MED ORDER — POVIDONE-IODINE 10 % EX SWAB
2.0000 "application " | Freq: Once | CUTANEOUS | Status: AC
  Administered 2020-06-06: 2 via TOPICAL

## 2020-06-06 MED ORDER — DIPHENHYDRAMINE HCL 12.5 MG/5ML PO ELIX
12.5000 mg | ORAL_SOLUTION | ORAL | Status: DC | PRN
  Administered 2020-06-07: 25 mg via ORAL
  Filled 2020-06-06: qty 10

## 2020-06-06 MED ORDER — LACTATED RINGERS IV BOLUS: 500.0000 mL | Freq: Once | INTRAVENOUS | Status: DC

## 2020-06-06 MED ORDER — ACETAMINOPHEN 325 MG PO TABS: 325.0000 mg | ORAL_TABLET | Freq: Four times a day (QID) | ORAL | Status: DC | PRN

## 2020-06-06 MED ORDER — DEXAMETHASONE SODIUM PHOSPHATE 10 MG/ML IJ SOLN
INTRAMUSCULAR | Status: DC | PRN
  Administered 2020-06-06: 10 mg via INTRAVENOUS

## 2020-06-06 MED ORDER — SODIUM CHLORIDE (PF) 0.9 % IJ SOLN
INTRAMUSCULAR | Status: AC
  Filled 2020-06-06: qty 50

## 2020-06-06 MED ORDER — KETOROLAC TROMETHAMINE 30 MG/ML IJ SOLN
INTRAMUSCULAR | Status: AC
  Filled 2020-06-06: qty 1

## 2020-06-06 MED ORDER — SUCCINYLCHOLINE CHLORIDE 200 MG/10ML IV SOSY
PREFILLED_SYRINGE | INTRAVENOUS | Status: DC | PRN
  Administered 2020-06-06: 140 mg via INTRAVENOUS

## 2020-06-06 MED ORDER — KETOROLAC TROMETHAMINE 30 MG/ML IJ SOLN
INTRAMUSCULAR | Status: DC | PRN
  Administered 2020-06-06: 30 mg via INTRAVENOUS

## 2020-06-06 MED ORDER — ASCORBIC ACID 500 MG PO TABS
ORAL_TABLET | Freq: Every day | ORAL | Status: DC
  Administered 2020-06-07: 500 mg via ORAL
  Filled 2020-06-06 (×2): qty 1

## 2020-06-06 MED ORDER — HYDROMORPHONE HCL 2 MG/ML IJ SOLN
INTRAMUSCULAR | Status: AC
  Filled 2020-06-06: qty 1

## 2020-06-06 MED ORDER — LIDOCAINE 2% (20 MG/ML) 5 ML SYRINGE
INTRAMUSCULAR | Status: AC
  Filled 2020-06-06: qty 5

## 2020-06-06 MED ORDER — CEFAZOLIN SODIUM-DEXTROSE 2-4 GM/100ML-% IV SOLN
2.0000 g | Freq: Four times a day (QID) | INTRAVENOUS | Status: AC
  Administered 2020-06-06 – 2020-06-07 (×2): 2 g via INTRAVENOUS
  Filled 2020-06-06 (×2): qty 100

## 2020-06-06 MED ORDER — SENNA 8.6 MG PO TABS
1.0000 | ORAL_TABLET | Freq: Two times a day (BID) | ORAL | Status: DC
  Administered 2020-06-06: 8.6 mg via ORAL
  Filled 2020-06-06: qty 1

## 2020-06-06 MED ORDER — DOCUSATE SODIUM 100 MG PO CAPS: 100.0000 mg | ORAL_CAPSULE | Freq: Two times a day (BID) | ORAL | 1 refills | Status: AC

## 2020-06-06 MED ORDER — 0.9 % SODIUM CHLORIDE (POUR BTL) OPTIME
TOPICAL | Status: DC | PRN
  Administered 2020-06-06: 1000 mL

## 2020-06-06 MED ORDER — BUPIVACAINE-EPINEPHRINE 0.25% -1:200000 IJ SOLN
INTRAMUSCULAR | Status: DC | PRN
  Administered 2020-06-06: 30 mL

## 2020-06-06 MED ORDER — CEFAZOLIN SODIUM-DEXTROSE 2-4 GM/100ML-% IV SOLN
INTRAVENOUS | Status: AC
  Filled 2020-06-06: qty 200

## 2020-06-06 MED ORDER — ROCURONIUM BROMIDE 10 MG/ML (PF) SYRINGE
PREFILLED_SYRINGE | INTRAVENOUS | Status: AC
  Filled 2020-06-06: qty 10

## 2020-06-06 MED ORDER — DEXAMETHASONE SODIUM PHOSPHATE 10 MG/ML IJ SOLN
10.0000 mg | Freq: Once | INTRAMUSCULAR | Status: AC
  Administered 2020-06-07: 10 mg via INTRAVENOUS
  Filled 2020-06-06: qty 1

## 2020-06-06 MED ORDER — SODIUM CHLORIDE (PF) 0.9 % IJ SOLN
INTRAMUSCULAR | Status: DC | PRN
  Administered 2020-06-06: 30 mL via INTRAVENOUS

## 2020-06-06 MED ORDER — PROMETHAZINE HCL 25 MG/ML IJ SOLN: 6.2500 mg | INTRAMUSCULAR | Status: DC | PRN

## 2020-06-06 MED ORDER — POLYETHYLENE GLYCOL 3350 17 G PO PACK: 17.0000 g | PACK | Freq: Every day | ORAL | Status: DC | PRN

## 2020-06-06 MED ORDER — LIDOCAINE 2% (20 MG/ML) 5 ML SYRINGE
INTRAMUSCULAR | Status: DC | PRN
  Administered 2020-06-06: 100 mg via INTRAVENOUS

## 2020-06-06 MED ORDER — FENTANYL CITRATE (PF) 100 MCG/2ML IJ SOLN
INTRAMUSCULAR | Status: AC
  Filled 2020-06-06: qty 2

## 2020-06-06 MED ORDER — EPHEDRINE SULFATE-NACL 50-0.9 MG/10ML-% IV SOSY
PREFILLED_SYRINGE | INTRAVENOUS | Status: DC | PRN
  Administered 2020-06-06 (×5): 10 mg via INTRAVENOUS

## 2020-06-06 MED ORDER — ONDANSETRON HCL 4 MG PO TABS: 4.0000 mg | ORAL_TABLET | Freq: Four times a day (QID) | ORAL | Status: DC | PRN

## 2020-06-06 MED ORDER — MORPHINE SULFATE (PF) 2 MG/ML IV SOLN: 0.5000 mg | INTRAVENOUS | Status: DC | PRN

## 2020-06-06 MED ORDER — MIDAZOLAM HCL 2 MG/2ML IJ SOLN
INTRAMUSCULAR | Status: AC
  Filled 2020-06-06: qty 2

## 2020-06-06 MED ORDER — PROPOFOL 10 MG/ML IV BOLUS
INTRAVENOUS | Status: DC | PRN
  Administered 2020-06-06: 200 mg via INTRAVENOUS

## 2020-06-06 MED ORDER — ISOPROPYL ALCOHOL 70 % SOLN
Status: AC
  Filled 2020-06-06: qty 480

## 2020-06-06 SURGICAL SUPPLY — 61 items
BAG DECANTER FOR FLEXI CONT (MISCELLANEOUS) IMPLANT
BAG ZIPLOCK 12X15 (MISCELLANEOUS) IMPLANT
BLADE SURG SZ10 CARB STEEL (BLADE) IMPLANT
CHLORAPREP W/TINT 26 (MISCELLANEOUS) ×3 IMPLANT
COVER PERINEAL POST (MISCELLANEOUS) ×3 IMPLANT
COVER SURGICAL LIGHT HANDLE (MISCELLANEOUS) ×3 IMPLANT
COVER WAND RF STERILE (DRAPES) IMPLANT
CUP ACET PINNACLE SECTR 60MM (Hips) ×1 IMPLANT
DECANTER SPIKE VIAL GLASS SM (MISCELLANEOUS) ×3 IMPLANT
DERMABOND ADVANCED (GAUZE/BANDAGES/DRESSINGS) ×2
DERMABOND ADVANCED .7 DNX12 (GAUZE/BANDAGES/DRESSINGS) ×1 IMPLANT
DRAPE IMP U-DRAPE 54X76 (DRAPES) ×3 IMPLANT
DRAPE SHEET LG 3/4 BI-LAMINATE (DRAPES) ×9 IMPLANT
DRAPE STERI IOBAN 125X83 (DRAPES) IMPLANT
DRAPE U-SHAPE 47X51 STRL (DRAPES) ×6 IMPLANT
DRSG AQUACEL AG ADV 3.5X10 (GAUZE/BANDAGES/DRESSINGS) ×3 IMPLANT
ELECT REM PT RETURN 15FT ADLT (MISCELLANEOUS) ×3 IMPLANT
GAUZE SPONGE 4X4 12PLY STRL (GAUZE/BANDAGES/DRESSINGS) ×3 IMPLANT
GLOVE BIO SURGEON STRL SZ7.5 (GLOVE) ×3 IMPLANT
GLOVE BIO SURGEON STRL SZ8.5 (GLOVE) ×6 IMPLANT
GLOVE BIOGEL PI IND STRL 7.5 (GLOVE) ×1 IMPLANT
GLOVE BIOGEL PI IND STRL 8.5 (GLOVE) ×1 IMPLANT
GLOVE BIOGEL PI INDICATOR 7.5 (GLOVE) ×2
GLOVE BIOGEL PI INDICATOR 8.5 (GLOVE) ×2
GOWN SPEC L3 XXLG W/TWL (GOWN DISPOSABLE) ×3 IMPLANT
GOWN STRL REUS W/ TWL LRG LVL3 (GOWN DISPOSABLE) ×1 IMPLANT
GOWN STRL REUS W/TWL LRG LVL3 (GOWN DISPOSABLE) ×2
HANDPIECE INTERPULSE COAX TIP (DISPOSABLE) ×2
HEAD CERAMIC DELTA 36 PLUS 1.5 (Hips) ×3 IMPLANT
HOLDER FOLEY CATH W/STRAP (MISCELLANEOUS) ×3 IMPLANT
HOOD PEEL AWAY FLYTE STAYCOOL (MISCELLANEOUS) ×12 IMPLANT
JET LAVAGE IRRISEPT WOUND (IRRIGATION / IRRIGATOR) ×3
KIT TURNOVER KIT A (KITS) IMPLANT
LAVAGE JET IRRISEPT WOUND (IRRIGATION / IRRIGATOR) ×1 IMPLANT
LINER NEUTRAL 58X36MM PLUS4 ×3 IMPLANT
MANIFOLD NEPTUNE II (INSTRUMENTS) ×3 IMPLANT
MARKER SKIN DUAL TIP RULER LAB (MISCELLANEOUS) ×3 IMPLANT
NDL SAFETY ECLIPSE 18X1.5 (NEEDLE) ×1 IMPLANT
NEEDLE HYPO 18GX1.5 SHARP (NEEDLE) ×2
NEEDLE SPNL 18GX3.5 QUINCKE PK (NEEDLE) ×3 IMPLANT
PACK ANTERIOR HIP CUSTOM (KITS) ×3 IMPLANT
PENCIL SMOKE EVACUATOR (MISCELLANEOUS) IMPLANT
PINNSECTOR W/GRIP ACE CUP 60MM (Hips) ×3 IMPLANT
SAW OSC TIP CART 19.5X105X1.3 (SAW) ×3 IMPLANT
SEALER BIPOLAR AQUA 6.0 (INSTRUMENTS) ×3 IMPLANT
SET HNDPC FAN SPRY TIP SCT (DISPOSABLE) ×1 IMPLANT
STEM TRI LOC BPS SZ8 W GRIPTON ×1 IMPLANT
SUT ETHIBOND NAB CT1 #1 30IN (SUTURE) ×6 IMPLANT
SUT MNCRL AB 3-0 PS2 18 (SUTURE) ×3 IMPLANT
SUT MNCRL AB 4-0 PS2 18 (SUTURE) ×3 IMPLANT
SUT MON AB 2-0 CT1 36 (SUTURE) ×6 IMPLANT
SUT STRATAFIX PDO 1 14 VIOLET (SUTURE) ×2
SUT STRATFX PDO 1 14 VIOLET (SUTURE) ×1
SUT VIC AB 2-0 CT1 27 (SUTURE) ×2
SUT VIC AB 2-0 CT1 TAPERPNT 27 (SUTURE) ×1 IMPLANT
SUTURE STRATFX PDO 1 14 VIOLET (SUTURE) ×1 IMPLANT
SYR 3ML LL SCALE MARK (SYRINGE) ×3 IMPLANT
TRAY FOLEY MTR SLVR 16FR STAT (SET/KITS/TRAYS/PACK) IMPLANT
TRI LOC BPS SZ8 W GRIPTON ×3 IMPLANT
WATER STERILE IRR 1000ML POUR (IV SOLUTION) ×3 IMPLANT
YANKAUER SUCT BULB TIP 10FT TU (MISCELLANEOUS) ×3 IMPLANT

## 2020-06-06 NOTE — Discharge Instructions (Signed)
°Dr. Kadian Barcellos °Joint Replacement Specialist °[facility] °[address] °[place], [place] [address] °[phone] ° ° °TOTAL HIP REPLACEMENT POSTOPERATIVE DIRECTIONS ° ° ° °Hip Rehabilitation, Guidelines Following Surgery  ° °WEIGHT BEARING °Weight bearing as tolerated with assist device (walker, cane, etc) as directed, use it as long as suggested by your surgeon or therapist, typically at least 4-6 weeks. ° °The results of a hip operation are greatly improved after range of motion and muscle strengthening exercises. Follow all safety measures which are given to protect your hip. If any of these exercises cause increased pain or swelling in your joint, decrease the amount until you are comfortable again. Then slowly increase the exercises. Call your caregiver if you have problems or questions.  ° °HOME CARE INSTRUCTIONS  °Most of the following instructions are designed to prevent the dislocation of your new hip.  °Remove items at home which could result in a fall. This includes throw rugs or furniture in walking pathways.  °Continue medications as instructed at time of discharge. °· You may have some home medications which will be placed on hold until you complete the course of blood thinner medication. °· You may start showering once you are discharged home. Do not remove your dressing. °Do not put on socks or shoes without following the instructions of your caregivers.   °Sit on chairs with arms. Use the chair arms to help push yourself up when arising.  °Arrange for the use of a toilet seat elevator so you are not sitting low.  °· Walk with walker as instructed.  °You may resume a sexual relationship in one month or when given the OK by your caregiver.  °Use walker as long as suggested by your caregivers.  °You may put full weight on your legs and walk as much as is comfortable. °Avoid periods of inactivity such as sitting longer than an hour when not asleep. This helps prevent  blood clots.  °You may return to work once you are cleared by your surgeon.  °Do not drive a car for 6 weeks or until released by your surgeon.  °Do not drive while taking narcotics.  °Wear elastic stockings for two weeks following surgery during the day but you may remove then at night.  °Make sure you keep all of your appointments after your operation with all of your doctors and caregivers. You should call the office at the above phone number and make an appointment for approximately two weeks after the date of your surgery. °Please pick up a stool softener and laxative for home use as long as you are requiring pain medications. °· ICE to the affected hip every three hours for 30 minutes at a time and then as needed for pain and swelling. Continue to use ice on the hip for pain and swelling from surgery. You may notice swelling that will progress down to the foot and ankle.  This is normal after surgery.  Elevate the leg when you are not up walking on it.   °It is important for you to complete the blood thinner medication as prescribed by your doctor. °· Continue to use the breathing machine which will help keep your temperature down.  It is common for your temperature to cycle up and down following surgery, especially at night when you are not up moving around and exerting yourself.  The breathing machine keeps your lungs expanded and your temperature down. ° °RANGE OF MOTION AND STRENGTHENING EXERCISES  °These exercises are   designed to help you keep full movement of your hip joint. Follow your caregiver's or physical therapist's instructions. Perform all exercises about fifteen times, three times per day or as directed. Exercise both hips, even if you have had only one joint replacement. These exercises can be done on a training (exercise) mat, on the floor, on a table or on a bed. Use whatever works the best and is most comfortable for you. Use music or television while you are exercising so that the exercises  are a pleasant break in your day. This will make your life better with the exercises acting as a break in routine you can look forward to.  °Lying on your back, slowly slide your foot toward your buttocks, raising your knee up off the floor. Then slowly slide your foot back down until your leg is straight again.  °Lying on your back spread your legs as far apart as you can without causing discomfort.  °Lying on your side, raise your upper leg and foot straight up from the floor as far as is comfortable. Slowly lower the leg and repeat.  °Lying on your back, tighten up the muscle in the front of your thigh (quadriceps muscles). You can do this by keeping your leg straight and trying to raise your heel off the floor. This helps strengthen the largest muscle supporting your knee.  °Lying on your back, tighten up the muscles of your buttocks both with the legs straight and with the knee bent at a comfortable angle while keeping your heel on the floor.  ° °SKILLED REHAB INSTRUCTIONS: °If the patient is transferred to a skilled rehab facility following release from the hospital, a list of the current medications will be sent to the facility for the patient to continue.  When discharged from the skilled rehab facility, please have the facility set up the patient's Home Health Physical Therapy prior to being released. Also, the skilled facility will be responsible for providing the patient with their medications at time of release from the facility to include their pain medication and their blood thinner medication. If the patient is still at the rehab facility at time of the two week follow up appointment, the skilled rehab facility will also need to assist the patient in arranging follow up appointment in our office and any transportation needs. ° °MAKE SURE YOU:  °Understand these instructions.  °Will watch your condition.  °Will get help right away if you are not doing well or get worse. ° °Pick up stool softner and  laxative for home use following surgery while on pain medications. °Do not remove your dressing. °The dressing is waterproof--it is OK to take showers. °Continue to use ice for pain and swelling after surgery. °Do not use any lotions or creams on the incision until instructed by your surgeon. °Total Hip Protocol. ° ° °

## 2020-06-06 NOTE — Interval H&P Note (Signed)
History and Physical Interval Note:  06/06/2020 2:07 PM  Phillip Thomas  has presented today for surgery, with the diagnosis of Right hip osteoarthritis.  The various methods of treatment have been discussed with the patient and family. After consideration of risks, benefits and other options for treatment, the patient has consented to  Procedure(s): TOTAL HIP ARTHROPLASTY ANTERIOR APPROACH (Right) as a surgical intervention.  The patient's history has been reviewed, patient examined, no change in status, stable for surgery.  I have reviewed the patient's chart and labs.  Questions were answered to the patient's satisfaction.    The risks, benefits, and alternatives were discussed with the patient. There are risks associated with the surgery including, but not limited to, problems with anesthesia (death), infection, instability (giving out of the joint), dislocation, differences in leg length/angulation/rotation, fracture of bones, loosening or failure of implants, hematoma (blood accumulation) which may require surgical drainage, blood clots, pulmonary embolism, nerve injury (foot drop and lateral thigh numbness), and blood vessel injury. The patient understands these risks and elects to proceed.    Phillip Thomas

## 2020-06-06 NOTE — Op Note (Signed)
OPERATIVE REPORT  SURGEON: Samson Frederic, MD   ASSISTANT: Barrie Dunker, PA-C  PREOPERATIVE DIAGNOSIS: Right hip arthritis.   POSTOPERATIVE DIAGNOSIS: Right hip arthritis.   PROCEDURE: Right total hip arthroplasty, anterior approach.   IMPLANTS: DePuy Tri Lock stem, size 8, hi offset. DePuy Pinnacle Cup, size 60 mm. DePuy Altrx liner, size 36 by 60 mm, +4 neutral. DePuy Biolox ceramic head ball, size 36 + 1.5 mm.  ANESTHESIA:  General  ESTIMATED BLOOD LOSS:-400 mL    ANTIBIOTICS: 3g Ancef.  DRAINS: None.  COMPLICATIONS: None.   CONDITION: PACU - hemodynamically stable.   BRIEF CLINICAL NOTE: Phillip Thomas is a 59 y.o. male with a long-standing history of Right hip arthritis. After failing conservative management, the patient was indicated for total hip arthroplasty. The risks, benefits, and alternatives to the procedure were explained, and the patient elected to proceed.  PROCEDURE IN DETAIL: Surgical site was marked by myself in the pre-op holding area. Once inside the operating room, spinal anesthesia was obtained, and a foley catheter was inserted. The patient was then positioned on the Hana table.  All bony prominences were well padded.  The hip was prepped and draped in the normal sterile surgical fashion.  A time-out was called verifying side and site of surgery. The patient received IV antibiotics within 60 minutes of beginning the procedure.   The direct anterior approach to the hip was performed through the Hueter interval.  Lateral femoral circumflex vessels were treated with the Auqumantys. The anterior capsule was exposed and an inverted T capsulotomy was made. The femoral neck cut was made to the level of the templated cut.  A corkscrew was placed into the head and the head was removed.  The femoral head was found to have eburnated bone. The head was passed to the back table and was measured.   Acetabular exposure was achieved, and the pulvinar and labrum were  excised. Sequential reaming of the acetabulum was then performed up to a size 59 mm reamer. A 60 mm cup was then opened and impacted into place at approximately 40 degrees of abduction and 20 degrees of anteversion. The final polyethylene liner was impacted into place and acetabular osteophytes were removed.    I then gained femoral exposure taking care to protect the abductors and greater trochanter.  This was performed using standard external rotation, extension, and adduction.  The capsule was peeled off the inner aspect of the greater trochanter, taking care to preserve the short external rotators. A cookie cutter was used to enter the femoral canal, and then the femoral canal finder was placed.  Sequential broaching was performed up to a size 8.  Calcar planer was used on the femoral neck remnant.  I placed a hi offset neck and a trial head ball.  The hip was reduced.  Leg lengths and offset were checked fluoroscopically.  The hip was dislocated and trial components were removed.  The final implants were placed, and the hip was reduced.  Fluoroscopy was used to confirm component position and leg lengths.  At 90 degrees of external rotation and full extension, the hip was stable to an anterior directed force.   The wound was copiously irrigated with Irrisept solution and normal saline using pule lavage.  Marcaine solution was injected into the periarticular soft tissue.  The wound was closed in layers using #1 Stratafix for the fascia, 2-0 Vicryl for the subcutaneous fat, 2-0 Monocryl for the deep dermal layer, 3-0 running Monocryl subcuticular stitch, and Dermabond  for the skin.  Once the glue was fully dried, an Aquacell Ag dressing was applied.  The patient was transported to the recovery room in stable condition.  Sponge, needle, and instrument counts were correct at the end of the case x2.  The patient tolerated the procedure well and there were no known complications.  Please note that a surgical  assistant was a medical necessity for this procedure to perform it in a safe and expeditious manner. Assistant was necessary to provide appropriate retraction of vital neurovascular structures, to prevent femoral fracture, and to allow for anatomic placement of the prosthesis.

## 2020-06-06 NOTE — Plan of Care (Signed)

## 2020-06-06 NOTE — Progress Notes (Signed)
Dr Veda Canning notified that pt will not be able to be safely discharged to home today.  Pt arrived in PACU at 5pm.  Pts wife updated as well.

## 2020-06-06 NOTE — Anesthesia Postprocedure Evaluation (Signed)
Anesthesia Post Note  Patient: Phillip Thomas  Procedure(s) Performed: TOTAL HIP ARTHROPLASTY ANTERIOR APPROACH (Right Hip)     Patient location during evaluation: PACU Anesthesia Type: General Level of consciousness: sedated Pain management: pain level controlled Vital Signs Assessment: post-procedure vital signs reviewed and stable Respiratory status: spontaneous breathing and respiratory function stable Cardiovascular status: stable Postop Assessment: no apparent nausea or vomiting Anesthetic complications: no   No complications documented.  Last Vitals:  Vitals:   06/06/20 1800 06/06/20 1830  BP: 121/81 134/89  Pulse: 68 68  Resp: 17 18  Temp:  (!) 36.4 C  SpO2: 100% 98%    Last Pain:  Vitals:   06/06/20 1830  TempSrc: Oral  PainSc:                  Jeilani Grupe DANIEL

## 2020-06-06 NOTE — Transfer of Care (Signed)
Immediate Anesthesia Transfer of Care Note  Patient: Phillip Thomas  Procedure(s) Performed: TOTAL HIP ARTHROPLASTY ANTERIOR APPROACH (Right Hip)  Patient Location: PACU  Anesthesia Type:General  Level of Consciousness: awake, oriented, patient cooperative and responds to stimulation  Airway & Oxygen Therapy: Patient Spontanous Breathing and Patient connected to face mask oxygen  Post-op Assessment: Report given to RN and Post -op Vital signs reviewed and stable  Post vital signs: Reviewed and stable  Last Vitals:  Vitals Value Taken Time  BP 132/79 06/06/20 1705  Temp 97.9   Pulse 89 06/06/20 1708  Resp 14 06/06/20 1708  SpO2 100 % 06/06/20 1708  Vitals shown include unvalidated device data.  Last Pain:  Vitals:   06/06/20 0950  TempSrc: Oral  PainSc:       Patients Stated Pain Goal: 3 (06/06/20 0944)  Complications: No complications documented.

## 2020-06-06 NOTE — Progress Notes (Signed)
Orthopedic Tech Progress Note Patient Details:  Phillip Thomas 08-Apr-1961 [id number]  Ortho Devices Ortho Device/Splint Location: Trapeze bar Ortho Device/Splint Interventions: Application   Post Interventions Patient Tolerated: Well Instructions Provided: Care of device   Saul Fordyce 06/06/2020, 6:33 PM

## 2020-06-06 NOTE — Anesthesia Procedure Notes (Signed)
Procedure Name: Intubation Date/Time: 06/06/2020 2:15 PM Performed by: Silas Sacramento, CRNA Pre-anesthesia Checklist: Patient identified, Emergency Drugs available, Suction available and Patient being monitored Patient Re-evaluated:Patient Re-evaluated prior to induction Oxygen Delivery Method: Circle system utilized Preoxygenation: Pre-oxygenation with 100% oxygen Induction Type: IV induction and Rapid sequence Laryngoscope Size: Mac and 4 Grade View: Grade I Tube type: Oral Tube size: 7.5 mm Number of attempts: 1 Airway Equipment and Method: Stylet and Oral airway Placement Confirmation: ETT inserted through vocal cords under direct vision,  positive ETCO2 and breath sounds checked- equal and bilateral Secured at: 22 cm Tube secured with: Tape Dental Injury: Teeth and Oropharynx as per pre-operative assessment

## 2020-06-07 DIAGNOSIS — M1611 Unilateral primary osteoarthritis, right hip: Secondary | ICD-10-CM | POA: Diagnosis not present

## 2020-06-07 LAB — CBC
HCT: 35.8 % — ABNORMAL LOW (ref 39.0–52.0)
Hemoglobin: 12.2 g/dL — ABNORMAL LOW (ref 13.0–17.0)
MCH: 33.2 pg (ref 26.0–34.0)
MCHC: 34.1 g/dL (ref 30.0–36.0)
MCV: 97.5 fL (ref 80.0–100.0)
Platelets: 162 10*3/uL (ref 150–400)
RBC: 3.67 MIL/uL — ABNORMAL LOW (ref 4.22–5.81)
RDW: 13.2 % (ref 11.5–15.5)
WBC: 8.6 10*3/uL (ref 4.0–10.5)
nRBC: 0 % (ref 0.0–0.2)

## 2020-06-07 LAB — BASIC METABOLIC PANEL
Anion gap: 12 (ref 5–15)
BUN: 19 mg/dL (ref 6–20)
CO2: 22 mmol/L (ref 22–32)
Calcium: 8.7 mg/dL — ABNORMAL LOW (ref 8.9–10.3)
Chloride: 99 mmol/L (ref 98–111)
Creatinine, Ser: 1.03 mg/dL (ref 0.61–1.24)
GFR calc Af Amer: >60 mL/min (ref >60)
GFR calc non Af Amer: >60 mL/min (ref >60)
Glucose, Bld: 185 mg/dL — ABNORMAL HIGH (ref 70–99)
Potassium: 4.8 mmol/L (ref 3.5–5.1)
Sodium: 133 mmol/L — ABNORMAL LOW (ref 135–145)

## 2020-06-07 NOTE — Care Management Obs Status (Signed)
MEDICARE OBSERVATION STATUS NOTIFICATION   Patient Details  Name: Phillip Thomas MRN: [id number] Date of Birth: 15-Oct-1961   Medicare Observation Status Notification Given:  Yes    Amada Jupiter, LCSW 06/07/2020, 9:29 AM

## 2020-06-07 NOTE — Discharge Summary (Signed)
Physician Discharge Summary  Patient ID: Phillip Thomas MRN: [id number] DOB/AGE: 59-Sep-1962 59 y.o.  Admit date: 06/06/2020 Discharge date: 06/07/2020  Admission Diagnoses:  Osteoarthritis of right hip  Discharge Diagnoses:  Principal Problem:   Osteoarthritis of right hip Active Problems:   Arthritis of right hip   Past Medical History:  Diagnosis Date  . Abdominal aortic aneurysm (AAA) without rupture (HCC)   . Anxiety   . Arthritis    oa  . Depression   . Hypertension    off all bp meds for last year and 1/2   . PTSD (post-traumatic stress disorder)     Surgeries: Procedure(s): TOTAL HIP ARTHROPLASTY ANTERIOR APPROACH on 06/06/2020   Consultants (if any):   Discharged Condition: Improved  Hospital Course: Phillip Thomas is an 59 y.o. male who was admitted 06/06/2020 with a diagnosis of Osteoarthritis of right hip and went to the operating room on 06/06/2020 and underwent the above named procedures.    He was given perioperative antibiotics:  Anti-infectives (From admission, onward)   Start     Dose/Rate Route Frequency Ordered Stop   06/06/20 2000  ceFAZolin (ANCEF) IVPB 2g/100 mL premix        2 g 200 mL/hr over 30 Minutes Intravenous Every 6 hours 06/06/20 1826 06/07/20 0210   06/06/20 0958  ceFAZolin (ANCEF) 2-4 GM/100ML-% IVPB       Note to Pharmacy: Minor, Anneita   : cabinet override      06/06/20 0958 06/06/20 2159   06/06/20 0600  ceFAZolin (ANCEF) 3 g in dextrose 5 % 50 mL IVPB        3 g 100 mL/hr over 30 Minutes Intravenous On call to O.R. 06/05/20 0759 06/06/20 1415    .  He was given sequential compression devices, early ambulation, and aspirin for DVT prophylaxis.  He benefited maximally from the hospital stay and there were no complications.    Recent vital signs:  Vitals:   06/07/20 0534 06/07/20 0934  BP: 111/66 124/77  Pulse: 73 78  Resp: 17 18  Temp: 98.1 F (36.7 C) 98.3 F (36.8 C)  SpO2: 99% 96%    Recent laboratory studies:  Lab  Results  Component Value Date   HGB 12.2 (L) 06/07/2020   HGB 15.6 06/03/2020   HGB 11.1 (L) 02/28/2016   Lab Results  Component Value Date   WBC 8.6 06/07/2020   PLT 162 06/07/2020   Lab Results  Component Value Date   INR 1.0 06/03/2020   Lab Results  Component Value Date   NA 133 (L) 06/07/2020   K 4.8 06/07/2020   CL 99 06/07/2020   CO2 22 06/07/2020   BUN 19 06/07/2020   CREATININE 1.03 06/07/2020   GLUCOSE 185 (H) 06/07/2020    Discharge Medications:   Allergies as of 06/07/2020   No Known Allergies     Medication List    STOP taking these medications   aspirin 81 MG EC tablet Replaced by: aspirin 81 MG chewable tablet     TAKE these medications   aspirin 81 MG chewable tablet Commonly known as: Aspirin Childrens Chew 1 tablet (81 mg total) by mouth 2 (two) times daily with a meal. Replaces: aspirin 81 MG EC tablet   docusate sodium 100 MG capsule Commonly known as: Colace Take 1 capsule (100 mg total) by mouth 2 (two) times daily.   HYDROcodone-acetaminophen 5-325 MG tablet Commonly known as: Norco Take 1 tablet by mouth every 4 (four) hours as needed  for moderate pain.   ibuprofen 200 MG tablet Commonly known as: ADVIL Take 800 mg by mouth every 6 (six) hours as needed (For pain.).   lisinopril 20 MG tablet Commonly known as: ZESTRIL Take 20 mg by mouth daily.   ondansetron 4 MG tablet Commonly known as: Zofran Take 1 tablet (4 mg total) by mouth every 8 (eight) hours as needed for nausea or vomiting.   senna 8.6 MG Tabs tablet Commonly known as: SENOKOT Take 2 tablets (17.2 mg total) by mouth at bedtime.   VITA-C PO Take 1 tablet by mouth daily.   VITAMIN D3 PO Take 1 tablet by mouth daily.   ZINC PO Take 1 tablet by mouth daily.       Diagnostic Studies: DG Pelvis Portable  Result Date: 06/06/2020 CLINICAL DATA:  Postop EXAM: PORTABLE PELVIS 1-2 VIEWS COMPARISON:  02/27/2016 FINDINGS: Prior left hip replacement. Interval right  hip replacement with normal alignment and intact hardware. Pubic symphysis and rami appear intact. IMPRESSION: Interval right hip replacement with normal alignment. Electronically Signed   By: Jasmine Pang M.D.   On: 06/06/2020 17:48   DG C-Arm 1-60 Min-No Report  Result Date: 06/06/2020 Fluoroscopy was utilized by the requesting physician.  No radiographic interpretation.   DG HIP OPERATIVE UNILAT W OR W/O PELVIS RIGHT  Result Date: 06/06/2020 CLINICAL DATA:  Right hip arthroplasty EXAM: OPERATIVE right HIP (WITH PELVIS IF PERFORMED) 2 VIEWS TECHNIQUE: Fluoroscopic spot image(s) were submitted for interpretation post-operatively. COMPARISON:  02/27/2016 FINDINGS: Two low resolution spot images from right hip arthroplasty are submitted. Total fluoroscopy time was 22.2 seconds. The images demonstrate a right hip replacement with normal alignment IMPRESSION: Intraoperative fluoroscopic assistance provided during right hip arthroplasty. Electronically Signed   By: Jasmine Pang M.D.   On: 06/06/2020 17:47    Disposition: Discharge disposition: 01-Home or Self Care       Discharge Instructions    Call MD / Call 911   Complete by: As directed    If you experience chest pain or shortness of breath, CALL 911 and be transported to the hospital emergency room.  If you develope a fever above 101 F, pus (white drainage) or increased drainage or redness at the wound, or calf pain, call your surgeon's office.   Constipation Prevention   Complete by: As directed    Drink plenty of fluids.  Prune juice may be helpful.  You may use a stool softener, such as Colace (over the counter) 100 mg twice a day.  Use MiraLax (over the counter) for constipation as needed.   Diet - low sodium heart healthy   Complete by: As directed    Driving restrictions   Complete by: As directed    No driving for 6 weeks   Increase activity slowly as tolerated   Complete by: As directed    Lifting restrictions   Complete  by: As directed    No lifting for 6 weeks   TED hose   Complete by: As directed    Use stockings (TED hose) for 2 weeks on both leg(s).  You may remove them at night for sleeping.    Dental Antibiotics:  In most cases prophylactic antibiotics for Dental procdeures after total joint surgery are not necessary.  Exceptions are as follows:  1. History of prior total joint infection  2. Severely immunocompromised (Organ Transplant, cancer chemotherapy, Rheumatoid biologic meds such as Humera)  3. Poorly controlled diabetes (A1C &gt; 8.0, blood glucose over 200)  If you have one of these conditions, contact your surgeon for an antibiotic prescription, prior to your dental procedure.   Follow-up Information    Swinteck, Arlys John, MD In 2 weeks.   Specialty: Orthopedic Surgery Why: For wound re-check Contact information: [address] [place] [address] [phone]                Signed: Darrick Grinder 06/07/2020, 12:12 PM

## 2020-06-07 NOTE — Evaluation (Signed)
Physical Therapy One Time Evaluation Patient Details Name: Phillip Thomas MRN: [id number] DOB: Mar 30, 1961 Today's Date: 06/07/2020   History of Present Illness  Pt is a 59 year old male s/p R THA with hx of L THA in 2017  Clinical Impression  Patient evaluated by Physical Therapy with no further acute PT needs identified. All education has been completed and the patient has no further questions.  Pt ambulated in hallway and practiced safe step technique.  Pt provided with HEP, and he reports he is familiar with the exercises.  Pt had no questions and eager to d/c home today. See below for any follow-up Physical Therapy or equipment needs. PT is signing off. Thank you for this referral.     Follow Up Recommendations Follow surgeon's recommendation for DC plan and follow-up therapies (HEP)    Equipment Recommendations  None recommended by PT    Recommendations for Other Services       Precautions / Restrictions Precautions Precautions: None Restrictions RLE Weight Bearing: Weight bearing as tolerated      Mobility  Bed Mobility Overal bed mobility: Needs Assistance Bed Mobility: Supine to Sit     Supine to sit: Supervision        Transfers Overall transfer level: Needs assistance Equipment used: Rolling walker (2 wheeled) Transfers: Sit to/from Stand Sit to Stand: Min guard;Supervision         General transfer comment: cues for hand placement  Ambulation/Gait Ambulation/Gait assistance: Supervision;Min guard Gait Distance (Feet): 200 Feet Assistive device: Rolling walker (2 wheeled) Gait Pattern/deviations: Step-through pattern;Decreased stance time - right     General Gait Details: verbal cues for sequencing and RW positioning  Stairs Stairs: Yes Stairs assistance: Min guard Stair Management: Step to pattern;Forwards;With walker Number of Stairs: 1 General stair comments: verbal cues for sequence, RW positioning, safety; performed twice; pt reports  understanding  Wheelchair Mobility    Modified Rankin (Stroke Patients Only)       Balance                                             Pertinent Vitals/Pain Pain Assessment: No/denies pain    Home Living Family/patient expects to be discharged to:: Private residence Living Arrangements: Spouse/significant other   Type of Home: House Home Access: Stairs to enter Entrance Stairs-Rails: Can reach both;Left;Right Entrance Stairs-Number of Steps: 1 Home Layout: One level Home Equipment: Walker - 2 wheels;Cane - single point;Bedside commode      Prior Function Level of Independence: Independent               Hand Dominance        Extremity/Trunk Assessment        Lower Extremity Assessment Lower Extremity Assessment: RLE deficits/detail RLE Deficits / Details: anticipated post op hip weakness (grossly at least 2+/5 hip strength)       Communication   Communication: No difficulties  Cognition Arousal/Alertness: Awake/alert Behavior During Therapy: WFL for tasks assessed/performed Overall Cognitive Status: Within Functional Limits for tasks assessed                                        General Comments      Exercises     Assessment/Plan    PT Assessment Patent does not need any  further PT services  PT Problem List         PT Treatment Interventions      PT Goals (Current goals can be found in the Care Plan section)  Acute Rehab PT Goals PT Goal Formulation: All assessment and education complete, DC therapy    Frequency     Barriers to discharge        Co-evaluation               AM-PAC PT "6 Clicks" Mobility  Outcome Measure Help needed turning from your back to your side while in a flat bed without using bedrails?: None Help needed moving from lying on your back to sitting on the side of a flat bed without using bedrails?: None Help needed moving to and from a bed to a chair (including a  wheelchair)?: None Help needed standing up from a chair using your arms (e.g., wheelchair or bedside chair)?: A Little Help needed to walk in hospital room?: A Little Help needed climbing 3-5 steps with a railing? : A Little 6 Click Score: 21    End of Session Equipment Utilized During Treatment: Gait belt Activity Tolerance: Patient tolerated treatment well Patient left: in chair;with call bell/phone within reach Nurse Communication: Mobility status PT Visit Diagnosis: Difficulty in walking, not elsewhere classified (R26.2)    Time: 7741-2878 PT Time Calculation (min) (ACUTE ONLY): 24 min   Charges:   PT Evaluation $PT Eval Low Complexity: 1 Low PT Treatments $Gait Training: 8-22 mins   Paulino Door, DPT Acute Rehabilitation Services Pager: [phone] Office: [phone]  Weston Fulco,KATHrine E 06/07/2020, 1:01 PM

## 2020-06-07 NOTE — TOC Transition Note (Signed)
Transition of Care [facility]) - CM/SW Discharge Note   Patient Details  Name: Phillip Thomas MRN: [id number] Date of Birth: 1960/11/30  Transition of Care [facility]) CM/SW Contact:  Lennart Pall, LCSW Phone Number: 06/07/2020, 9:31 AM   Clinical Narrative:    Met briefly with pt this morning and confirming pt plans for HEP at home and has all needed DME.  No further TOC needs.   Final next level of care: Home/Self Care Barriers to Discharge: No Barriers Identified   Patient Goals and CMS Choice Patient states their goals for this hospitalization and ongoing recovery are:: hopes to go home today   Choice offered to / list presented to : NA  Discharge Placement                       Discharge Plan and Services                DME Arranged: N/A DME Agency: NA       HH Arranged: NA HH Agency: NA        Social Determinants of Health (SDOH) Interventions     Readmission Risk Interventions Readmission Risk Prevention Plan 06/07/2020  Post Dischage Appt Complete  Medication Screening Complete  Transportation Screening Complete  Some recent data might be hidden

## 2020-06-07 NOTE — Care Management CC44 (Signed)
Condition Code 44 Documentation Completed  Patient Details  Name: Phillip Thomas MRN: [id number] Date of Birth: 09-14-1961   Condition Code 44 given:  Yes Patient signature on Condition Code 44 notice:  Yes Documentation of 2 MD's agreement:  Yes Code 44 added to claim:  Yes    Mychal Decarlo, LCSW 06/07/2020, 9:29 AM

## 2020-06-07 NOTE — Progress Notes (Signed)
    Subjective:  Patient reports pain as mild to moderate.  Denies N/V/CP/SOB. Patient is resting comfortably in a recliner  Objective:   VITALS:   Vitals:   06/06/20 2134 06/07/20 0133 06/07/20 0534 06/07/20 0934  BP: 122/78 124/78 111/66 124/77  Pulse: 68 70 73 78  Resp: 17 17 17 18   Temp: 97.7 F (36.5 C) 98.3 F (36.8 C) 98.1 F (36.7 C) 98.3 F (36.8 C)  TempSrc: Oral Oral Oral Oral  SpO2: 98% 99% 99% 96%  Weight:      Height:        NAD ABD soft Neurovascular intact Sensation intact distally Intact pulses distally Dorsiflexion/Plantar flexion intact Incision: dressing C/D/I   Lab Results  Component Value Date   WBC 8.6 06/07/2020   HGB 12.2 (L) 06/07/2020   HCT 35.8 (L) 06/07/2020   MCV 97.5 06/07/2020   PLT 162 06/07/2020   BMET    Component Value Date/Time   NA 133 (L) 06/07/2020 0247   K 4.8 06/07/2020 0247   CL 99 06/07/2020 0247   CO2 22 06/07/2020 0247   GLUCOSE 185 (H) 06/07/2020 0247   BUN 19 06/07/2020 0247   CREATININE 1.03 06/07/2020 0247   CALCIUM 8.7 (L) 06/07/2020 0247   GFRNONAA >60 06/07/2020 0247   GFRAA >60 06/07/2020 0247     Assessment/Plan: 1 Day Post-Op   Principal Problem:   Osteoarthritis of right hip Active Problems:   Arthritis of right hip   WBAT with walker DVT ppx: Aspirin, SCDs, TEDS PO pain control PT/OT Dispo: D/C home today     06/09/2020 06/07/2020, 12:10 PM  [facility] is now [facility] [address], [place], [place] Phone: [phone] [email or web address] Facebook  [phone]

## 2020-06-09 NOTE — Discharge Summary (Signed)
Physician Discharge Summary  Patient ID: Phillip Thomas MRN: [id number] DOB/AGE: [birth year] 59 y.o.  Admit date: 06/06/2020 Discharge date: 06/07/2020  Admission Diagnoses:  Osteoarthritis of right hip  Discharge Diagnoses:  Principal Problem:   Osteoarthritis of right hip Active Problems:   Arthritis of right hip   Past Medical History:  Diagnosis Date   Abdominal aortic aneurysm (AAA) without rupture (HCC)    Anxiety    Arthritis    oa   Depression    Hypertension    off all bp meds for last year and 1/2    PTSD (post-traumatic stress disorder)     Surgeries: Procedure(s): TOTAL HIP ARTHROPLASTY ANTERIOR APPROACH on 06/06/2020   Consultants (if any):   Discharged Condition: Improved  Hospital Course: Phillip Thomas is an 59 y.o. male who was admitted 06/06/2020 with a diagnosis of Osteoarthritis of right hip and went to the operating room on 06/06/2020 and underwent the above named procedures.    He was given perioperative antibiotics:  Anti-infectives (From admission, onward)   Start     Dose/Rate Route Frequency Ordered Stop   06/06/20 2000  ceFAZolin (ANCEF) IVPB 2g/100 mL premix        2 g 200 mL/hr over 30 Minutes Intravenous Every 6 hours 06/06/20 1826 06/07/20 0210   06/06/20 0958  ceFAZolin (ANCEF) 2-4 GM/100ML-% IVPB       Note to Pharmacy: Minor, Anneita   : cabinet override      06/06/20 0958 06/06/20 2159   06/06/20 0600  ceFAZolin (ANCEF) 3 g in dextrose 5 % 50 mL IVPB        3 g 100 mL/hr over 30 Minutes Intravenous On call to O.R. 06/05/20 0759 06/06/20 1415    .  He was given sequential compression devices, early ambulation, and ASA for DVT prophylaxis.  He benefited maximally from the hospital stay and there were no complications.    Recent vital signs:  Vitals:   06/07/20 0534 06/07/20 0934  BP: 111/66 124/77  Pulse: 73 78  Resp: 17 18  Temp: 98.1 F (36.7 C) 98.3 F (36.8 C)  SpO2: 99% 96%    Recent laboratory studies:  Lab Results   Component Value Date   HGB 12.2 (L) 06/07/2020   HGB 15.6 06/03/2020   HGB 11.1 (L) 02/28/2016   Lab Results  Component Value Date   WBC 8.6 06/07/2020   PLT 162 06/07/2020   Lab Results  Component Value Date   INR 1.0 06/03/2020   Lab Results  Component Value Date   NA 133 (L) 06/07/2020   K 4.8 06/07/2020   CL 99 06/07/2020   CO2 22 06/07/2020   BUN 19 06/07/2020   CREATININE 1.03 06/07/2020   GLUCOSE 185 (H) 06/07/2020    Discharge Medications:   Allergies as of 06/07/2020   No Known Allergies     Medication List    STOP taking these medications   aspirin 81 MG EC tablet Replaced by: aspirin 81 MG chewable tablet     TAKE these medications   aspirin 81 MG chewable tablet Commonly known as: Aspirin Childrens Chew 1 tablet (81 mg total) by mouth 2 (two) times daily with a meal. Replaces: aspirin 81 MG EC tablet   docusate sodium 100 MG capsule Commonly known as: Colace Take 1 capsule (100 mg total) by mouth 2 (two) times daily.   HYDROcodone-acetaminophen 5-325 MG tablet Commonly known as: Norco Take 1 tablet by mouth every 4 (four) hours as needed  for moderate pain.   ibuprofen 200 MG tablet Commonly known as: ADVIL Take 800 mg by mouth every 6 (six) hours as needed (For pain.).   lisinopril 20 MG tablet Commonly known as: ZESTRIL Take 20 mg by mouth daily.   ondansetron 4 MG tablet Commonly known as: Zofran Take 1 tablet (4 mg total) by mouth every 8 (eight) hours as needed for nausea or vomiting.   senna 8.6 MG Tabs tablet Commonly known as: SENOKOT Take 2 tablets (17.2 mg total) by mouth at bedtime.   VITA-C PO Take 1 tablet by mouth daily.   VITAMIN D3 PO Take 1 tablet by mouth daily.   ZINC PO Take 1 tablet by mouth daily.       Diagnostic Studies: DG Pelvis Portable  Result Date: 06/06/2020 CLINICAL DATA:  Postop EXAM: PORTABLE PELVIS 1-2 VIEWS COMPARISON:  02/27/2016 FINDINGS: Prior left hip replacement. Interval right hip  replacement with normal alignment and intact hardware. Pubic symphysis and rami appear intact. IMPRESSION: Interval right hip replacement with normal alignment. Electronically Signed   By: Jasmine Pang M.D.   On: 06/06/2020 17:48   DG C-Arm 1-60 Min-No Report  Result Date: 06/06/2020 Fluoroscopy was utilized by the requesting physician.  No radiographic interpretation.   DG HIP OPERATIVE UNILAT W OR W/O PELVIS RIGHT  Result Date: 06/06/2020 CLINICAL DATA:  Right hip arthroplasty EXAM: OPERATIVE right HIP (WITH PELVIS IF PERFORMED) 2 VIEWS TECHNIQUE: Fluoroscopic spot image(s) were submitted for interpretation post-operatively. COMPARISON:  02/27/2016 FINDINGS: Two low resolution spot images from right hip arthroplasty are submitted. Total fluoroscopy time was 22.2 seconds. The images demonstrate a right hip replacement with normal alignment IMPRESSION: Intraoperative fluoroscopic assistance provided during right hip arthroplasty. Electronically Signed   By: Jasmine Pang M.D.   On: 06/06/2020 17:47    Disposition: Discharge disposition: 01-Home or Self Care       Discharge Instructions    Call MD / Call 911   Complete by: As directed    If you experience chest pain or shortness of breath, CALL 911 and be transported to the hospital emergency room.  If you develope a fever above 101 F, pus (white drainage) or increased drainage or redness at the wound, or calf pain, call your surgeon's office.   Call MD / Call 911   Complete by: As directed    If you experience chest pain or shortness of breath, CALL 911 and be transported to the hospital emergency room.  If you develope a fever above 101 F, pus (white drainage) or increased drainage or redness at the wound, or calf pain, call your surgeon's office.   Constipation Prevention   Complete by: As directed    Drink plenty of fluids.  Prune juice may be helpful.  You may use a stool softener, such as Colace (over the counter) 100 mg twice a day.   Use MiraLax (over the counter) for constipation as needed.   Constipation Prevention   Complete by: As directed    Drink plenty of fluids.  Prune juice may be helpful.  You may use a stool softener, such as Colace (over the counter) 100 mg twice a day.  Use MiraLax (over the counter) for constipation as needed.   Diet - low sodium heart healthy   Complete by: As directed    Diet - low sodium heart healthy   Complete by: As directed    Driving restrictions   Complete by: As directed    No driving  for 6 weeks   Driving restrictions   Complete by: As directed    No driving for 6 weeks   Increase activity slowly as tolerated   Complete by: As directed    Increase activity slowly as tolerated   Complete by: As directed    Lifting restrictions   Complete by: As directed    No lifting for 6 weeks   Lifting restrictions   Complete by: As directed    No lifting for 6 weeks   TED hose   Complete by: As directed    Use stockings (TED hose) for 2 weeks on both leg(s).  You may remove them at night for sleeping.   TED hose   Complete by: As directed    Use stockings (TED hose) for 2 weeks on both leg(s).  You may remove them at night for sleeping.       Follow-up Information    Ashlley Booher, Arlys John, MD In 2 weeks.   Specialty: Orthopedic Surgery Why: For wound re-check Contact information: [address] [place] [address] [phone]                Signed: Iline Oven Curry Seefeldt 06/09/2020, 11:57 AM

## 2020-06-10 ENCOUNTER — Encounter (HOSPITAL_COMMUNITY): Payer: Self-pay | Admitting: Orthopedic Surgery

## 2021-01-14 ENCOUNTER — Ambulatory Visit: Payer: Self-pay | Admitting: Student

## 2021-02-11 ENCOUNTER — Ambulatory Visit: Payer: Self-pay | Admitting: Student

## 2021-02-11 NOTE — H&P (View-Only) (Signed)
TOTAL KNEE ADMISSION H&P  Patient is being admitted for right total knee arthroplasty.  Subjective:  Chief Complaint:right knee pain.  HPI: Phillip Thomas, 60 y.o. male, has a history of pain and functional disability in the right knee due to arthritis and has failed non-surgical conservative treatments for greater than 12 weeks to includeNSAID's and/or analgesics, corticosteriod injections and activity modification.  Onset of symptoms was gradual, starting 3 years ago with gradually worsening course since that time. The patient noted no past surgery on the right knee(s).  Patient currently rates pain in the right knee(s) at 8 out of 10 with activity. Patient has worsening of pain with activity and weight bearing, pain that interferes with activities of daily living and pain with passive range of motion.  Patient has evidence of subchondral cysts, subchondral sclerosis and joint space narrowing by imaging studies.. There is no active infection.  Patient Active Problem List   Diagnosis Date Noted  . Osteoarthritis of right hip 06/06/2020  . Arthritis of right hip 06/06/2020  . Primary osteoarthritis of left hip 02/27/2016   Past Medical History:  Diagnosis Date  . Abdominal aortic aneurysm (AAA) without rupture (HCC)   . Anxiety   . Arthritis    oa  . Depression   . Hypertension    off all bp meds for last year and 1/2   . PTSD (post-traumatic stress disorder)     Past Surgical History:  Procedure Laterality Date  . AORTA SURGERY  01/29/2020  . APPENDECTOMY  as child  . HERNIA REPAIR     x 2 single, 1 double  . TOTAL HIP ARTHROPLASTY Left 02/27/2016   Procedure: LEFT TOTAL HIP ARTHROPLASTY ANTERIOR APPROACH;  Surgeon: Samson Frederic, MD;  Location: [facility];  Service: Orthopedics;  Laterality: Left;  . TOTAL HIP ARTHROPLASTY Right 06/06/2020   Procedure: TOTAL HIP ARTHROPLASTY ANTERIOR APPROACH;  Surgeon: Samson Frederic, MD;  Location: [facility];  Service: Orthopedics;  Laterality: Right;   . WISDOM TOOTH EXTRACTION  2010    Current Outpatient Medications  Medication Sig Dispense Refill Last Dose  . Ascorbic Acid (VITA-C PO) Take 1 tablet by mouth daily.     . Cholecalciferol (VITAMIN D3 PO) Take 1 tablet by mouth daily.     Marland Kitchen HYDROcodone-acetaminophen (NORCO) 5-325 MG tablet Take 1 tablet by mouth every 4 (four) hours as needed for moderate pain. 40 tablet 0   . ibuprofen (ADVIL,MOTRIN) 200 MG tablet Take 800 mg by mouth every 6 (six) hours as needed (For pain.).     Marland Kitchen lisinopril (ZESTRIL) 20 MG tablet Take 20 mg by mouth daily.     . Multiple Vitamins-Minerals (ZINC PO) Take 1 tablet by mouth daily.     . ondansetron (ZOFRAN) 4 MG tablet Take 1 tablet (4 mg total) by mouth every 8 (eight) hours as needed for nausea or vomiting. 20 tablet 0    No current facility-administered medications for this visit.   No Known Allergies  Social History   Tobacco Use  . Smoking status: Current Every Day Smoker    Packs/day: 0.50    Years: 30.00    Pack years: 15.00    Types: Cigarettes  . Smokeless tobacco: Never Used  Substance Use Topics  . Alcohol use: Yes    Alcohol/week: 4.0 standard drinks    Types: 4 Cans of beer per week    Comment: 4 cans a day, 3x a week.    No family history on file.   Review of  Systems  Musculoskeletal: Positive for arthralgias.  All other systems reviewed and are negative.   Objective:  Physical Exam HENT:     Head: Normocephalic.  Eyes:     Pupils: Pupils are equal, round, and reactive to light.  Cardiovascular:     Rate and Rhythm: Normal rate and regular rhythm.     Heart sounds: Normal heart sounds.  Pulmonary:     Effort: Pulmonary effort is normal.  Abdominal:     Palpations: Abdomen is soft.     Tenderness: There is no abdominal tenderness.  Genitourinary:    Comments: Deferred Musculoskeletal:     Cervical back: Normal range of motion and neck supple.     Comments: Examination the right knee reveals no skin wounds or  lesions. He has a varus deformity. He has swelling, trace effusion. No warmth or erythema. Tenderness to palpation medial joint line, lateral joint line, peripatellar retinacular tissues with positive grind sign. Range of motion is 5 to 120 degrees. He does have varus valgus pseudolaxity. Painless range of motion of the hip.  Skin:    General: Skin is warm and dry.  Neurological:     Mental Status: He is alert and oriented to person, place, and time.  Psychiatric:        Mood and Affect: Mood normal.     Vital signs in last 24 hours: @VSRANGES @  Labs:   Estimated body mass index is 36.91 kg/m as calculated from the following:   Height as of 06/06/20: 6\' 2"  (1.88 m).   Weight as of 06/06/20: 130.4 kg.   Imaging Review Plain radiographs demonstrate severe degenerative joint disease of the right knee(s). The overall alignment ismild varus. The bone quality appears to be adequate for age and reported activity level.      Assessment/Plan:  End stage arthritis, right knee   The patient history, physical examination, clinical judgment of the provider and imaging studies are consistent with end stage degenerative joint disease of the right knee(s) and total knee arthroplasty is deemed medically necessary. The treatment options including medical management, injection therapy arthroscopy and arthroplasty were discussed at length. The risks and benefits of total knee arthroplasty were presented and reviewed. The risks due to aseptic loosening, infection, stiffness, patella tracking problems, thromboembolic complications and other imponderables were discussed. The patient acknowledged the explanation, agreed to proceed with the plan and consent was signed. Patient is being admitted for inpatient treatment for surgery, pain control, PT, OT, prophylactic antibiotics, VTE prophylaxis, progressive ambulation and ADL's and discharge planning. The patient is planning to be discharged home same  day     Patient's anticipated LOS is less than 2 midnights, meeting these requirements: - Younger than 59 - Lives within 1 hour of care - Has a competent adult at home to recover with post-op recover - NO history of  - Chronic pain requiring opiods  - Diabetes  - Coronary Artery Disease  - Heart failure  - Heart attack  - Stroke  - DVT/VTE  - Cardiac arrhythmia  - Respiratory Failure/COPD  - Renal failure  - Anemia  - Advanced Liver disease

## 2021-02-11 NOTE — H&P (Signed)
TOTAL KNEE ADMISSION H&P  Patient is being admitted for right total knee arthroplasty.  Subjective:  Chief Complaint:right knee pain.  HPI: Phillip Thomas, 60 y.o. male, has a history of pain and functional disability in the right knee due to arthritis and has failed non-surgical conservative treatments for greater than 12 weeks to includeNSAID's and/or analgesics, corticosteriod injections and activity modification.  Onset of symptoms was gradual, starting 3 years ago with gradually worsening course since that time. The patient noted no past surgery on the right knee(s).  Patient currently rates pain in the right knee(s) at 8 out of 10 with activity. Patient has worsening of pain with activity and weight bearing, pain that interferes with activities of daily living and pain with passive range of motion.  Patient has evidence of subchondral cysts, subchondral sclerosis and joint space narrowing by imaging studies.. There is no active infection.  Patient Active Problem List   Diagnosis Date Noted  . Osteoarthritis of right hip 06/06/2020  . Arthritis of right hip 06/06/2020  . Primary osteoarthritis of left hip 02/27/2016   Past Medical History:  Diagnosis Date  . Abdominal aortic aneurysm (AAA) without rupture (HCC)   . Anxiety   . Arthritis    oa  . Depression   . Hypertension    off all bp meds for last year and 1/2   . PTSD (post-traumatic stress disorder)     Past Surgical History:  Procedure Laterality Date  . AORTA SURGERY  01/29/2020  . APPENDECTOMY  as child  . HERNIA REPAIR     x 2 single, 1 double  . TOTAL HIP ARTHROPLASTY Left 02/27/2016   Procedure: LEFT TOTAL HIP ARTHROPLASTY ANTERIOR APPROACH;  Surgeon: Samson Frederic, MD;  Location: [facility];  Service: Orthopedics;  Laterality: Left;  . TOTAL HIP ARTHROPLASTY Right 06/06/2020   Procedure: TOTAL HIP ARTHROPLASTY ANTERIOR APPROACH;  Surgeon: Samson Frederic, MD;  Location: [facility];  Service: Orthopedics;  Laterality: Right;   . WISDOM TOOTH EXTRACTION  2010    Current Outpatient Medications  Medication Sig Dispense Refill Last Dose  . Ascorbic Acid (VITA-C PO) Take 1 tablet by mouth daily.     . Cholecalciferol (VITAMIN D3 PO) Take 1 tablet by mouth daily.     Marland Kitchen HYDROcodone-acetaminophen (NORCO) 5-325 MG tablet Take 1 tablet by mouth every 4 (four) hours as needed for moderate pain. 40 tablet 0   . ibuprofen (ADVIL,MOTRIN) 200 MG tablet Take 800 mg by mouth every 6 (six) hours as needed (For pain.).     Marland Kitchen lisinopril (ZESTRIL) 20 MG tablet Take 20 mg by mouth daily.     . Multiple Vitamins-Minerals (ZINC PO) Take 1 tablet by mouth daily.     . ondansetron (ZOFRAN) 4 MG tablet Take 1 tablet (4 mg total) by mouth every 8 (eight) hours as needed for nausea or vomiting. 20 tablet 0    No current facility-administered medications for this visit.   No Known Allergies  Social History   Tobacco Use  . Smoking status: Current Every Day Smoker    Packs/day: 0.50    Years: 30.00    Pack years: 15.00    Types: Cigarettes  . Smokeless tobacco: Never Used  Substance Use Topics  . Alcohol use: Yes    Alcohol/week: 4.0 standard drinks    Types: 4 Cans of beer per week    Comment: 4 cans a day, 3x a week.    No family history on file.   Review of  Systems  Musculoskeletal: Positive for arthralgias.  All other systems reviewed and are negative.   Objective:  Physical Exam HENT:     Head: Normocephalic.  Eyes:     Pupils: Pupils are equal, round, and reactive to light.  Cardiovascular:     Rate and Rhythm: Normal rate and regular rhythm.     Heart sounds: Normal heart sounds.  Pulmonary:     Effort: Pulmonary effort is normal.  Abdominal:     Palpations: Abdomen is soft.     Tenderness: There is no abdominal tenderness.  Genitourinary:    Comments: Deferred Musculoskeletal:     Cervical back: Normal range of motion and neck supple.     Comments: Examination the right knee reveals no skin wounds or  lesions. He has a varus deformity. He has swelling, trace effusion. No warmth or erythema. Tenderness to palpation medial joint line, lateral joint line, peripatellar retinacular tissues with positive grind sign. Range of motion is 5 to 120 degrees. He does have varus valgus pseudolaxity. Painless range of motion of the hip.  Skin:    General: Skin is warm and dry.  Neurological:     Mental Status: He is alert and oriented to person, place, and time.  Psychiatric:        Mood and Affect: Mood normal.     Vital signs in last 24 hours: @VSRANGES@  Labs:   Estimated body mass index is 36.91 kg/m as calculated from the following:   Height as of 06/06/20: 6' 2" (1.88 m).   Weight as of 06/06/20: 130.4 kg.   Imaging Review Plain radiographs demonstrate severe degenerative joint disease of the right knee(s). The overall alignment ismild varus. The bone quality appears to be adequate for age and reported activity level.      Assessment/Plan:  End stage arthritis, right knee   The patient history, physical examination, clinical judgment of the provider and imaging studies are consistent with end stage degenerative joint disease of the right knee(s) and total knee arthroplasty is deemed medically necessary. The treatment options including medical management, injection therapy arthroscopy and arthroplasty were discussed at length. The risks and benefits of total knee arthroplasty were presented and reviewed. The risks due to aseptic loosening, infection, stiffness, patella tracking problems, thromboembolic complications and other imponderables were discussed. The patient acknowledged the explanation, agreed to proceed with the plan and consent was signed. Patient is being admitted for inpatient treatment for surgery, pain control, PT, OT, prophylactic antibiotics, VTE prophylaxis, progressive ambulation and ADL's and discharge planning. The patient is planning to be discharged home same  day     Patient's anticipated LOS is less than 2 midnights, meeting these requirements: - Younger than 65 - Lives within 1 hour of care - Has a competent adult at home to recover with post-op recover - NO history of  - Chronic pain requiring opiods  - Diabetes  - Coronary Artery Disease  - Heart failure  - Heart attack  - Stroke  - DVT/VTE  - Cardiac arrhythmia  - Respiratory Failure/COPD  - Renal failure  - Anemia  - Advanced Liver disease     

## 2021-02-28 NOTE — Patient Instructions (Addendum)
DUE TO COVID-19 ONLY ONE VISITOR IS ALLOWED TO COME WITH YOU AND STAY IN THE WAITING ROOM ONLY DURING PRE OP AND PROCEDURE DAY OF SURGERY. THE 2 VISITORS  MAY VISIT WITH YOU AFTER SURGERY IN YOUR PRIVATE ROOM DURING VISITING HOURS ONLY!  YOU NEED TO HAVE A COVID 19 TEST ON___5/16____ @_11 :10______, THIS TEST MUST BE DONE BEFORE SURGERY,  COVID TESTING SITE [address] [place] , IT IS ON THE RIGHT GOING OUT [address] APPROXIMATELY  2 MINUTES PAST [employer] ON THE RIGHT. ONCE YOUR COVID TEST IS COMPLETED,  PLEASE BEGIN THE QUARANTINE INSTRUCTIONS AS OUTLINED IN YOUR HANDOUT.                [address]    Your procedure is scheduled on: 03/05/21   Report to [facility] Main  Entrance   Report to admitting at  6:00 AM     Call this number if you have problems the morning of surgery [phone]   BRUSH YOUR TEETH MORNING OF SURGERY AND RINSE YOUR MOUTH OUT, NO CHEWING GUM CANDY OR MINTS.  No food after midnight.    You may have clear liquid until 5:30 AM.    At 5:00 AM drink pre surgery drink.   Nothing by mouth after 5:30 AM.    Take these medicines the morning of surgery with A SIP OF WATER: none                                You may not have any metal on your body including              piercings  Do not wear jewelry,  lotions, powders or deodorant              Men may shave face and neck.   Do not bring valuables to the hospital. [place] IS NOT             RESPONSIBLE   FOR VALUABLES.  Contacts, dentures or bridgework may not be worn into surgery.      Patients discharged the day of surgery will not be allowed to drive home  . IF YOU ARE HAVING SURGERY AND GOING HOME THE SAME DAY, YOU MUST HAVE AN ADULT TO DRIVE YOU HOME AND BE WITH YOU FOR 24 HOURS.   YOU MAY GO HOME BY TAXI OR UBER OR ORTHERWISE, BUT AN ADULT MUST ACCOMPANY YOU HOME AND STAY WITH YOU FOR 24 HOURS.  Name and phone number of your driver:  Special  Instructions: N/A              Please read over the following fact sheets you were given: _____________________________________________________________________             [facility] - Preparing for Surgery Before surgery, you can play an important role.  Because skin is not sterile, your skin needs to be as free of germs as possible.  You can reduce the number of germs on your skin by washing with CHG (chlorahexidine gluconate) soap before surgery.  CHG is an antiseptic cleaner which kills germs and bonds with the skin to continue killing germs even after washing. Please DO NOT use if you have an allergy to CHG or antibacterial soaps.  If your skin becomes reddened/irritated stop using the CHG and inform your nurse when you arrive at Short Stay.  Please follow these instructions  carefully:  1.  Shower with CHG Soap the night before surgery and the  morning of Surgery.  2.  If you choose to wash your hair, wash your hair first as usual with your  normal  shampoo.  3.  After you shampoo, rinse your hair and body thoroughly to remove the  shampoo.                                        4.  Use CHG as you would any other liquid soap.  You can apply chg directly  to the skin and wash                       Gently with a scrungie or clean washcloth.  5.  Apply the CHG Soap to your body ONLY FROM THE NECK DOWN.   Do not use on face/ open                           Wound or open sores. Avoid contact with eyes, ears mouth and genitals (private parts).                       Wash face,  Genitals (private parts) with your normal soap.             6.  Wash thoroughly, paying special attention to the area where your surgery  will be performed.  7.  Thoroughly rinse your body with warm water from the neck down.  8.  DO NOT shower/wash with your normal soap after using and rinsing off  the CHG Soap.             9.  Pat yourself dry with a clean towel.            10.  Wear clean pajamas.            11.   Place clean sheets on your bed the night of your first shower and do not  sleep with pets. Day of Surgery : Do not apply any lotions/deodorants the morning of surgery.  Please wear clean clothes to the hospital/surgery center.  FAILURE TO FOLLOW THESE INSTRUCTIONS MAY RESULT IN THE CANCELLATION OF YOUR SURGERY PATIENT SIGNATURE_________________________________  NURSE SIGNATURE__________________________________  ________________________________________________________________________   Phillip Thomas  An incentive spirometer is a tool that can help keep your lungs clear and active. This tool measures how well you are filling your lungs with each breath. Taking long deep breaths may help reverse or decrease the chance of developing breathing (pulmonary) problems (especially infection) following:  A long period of time when you are unable to move or be active. BEFORE THE PROCEDURE   If the spirometer includes an indicator to show your best effort, your nurse or respiratory therapist will set it to a desired goal.  If possible, sit up straight or lean slightly forward. Try not to slouch.  Hold the incentive spirometer in an upright position. INSTRUCTIONS FOR USE  1. Sit on the edge of your bed if possible, or sit up as far as you can in bed or on a chair. 2. Hold the incentive spirometer in an upright position. 3. Breathe out normally. 4. Place the mouthpiece in your mouth and seal your lips tightly around it. 5. Breathe in slowly and as deeply as possible,  raising the piston or the ball toward the top of the column. 6. Hold your breath for 3-5 seconds or for as long as possible. Allow the piston or ball to fall to the bottom of the column. 7. Remove the mouthpiece from your mouth and breathe out normally. 8. Rest for a few seconds and repeat Steps 1 through 7 at least 10 times every 1-2 hours when you are awake. Take your time and take a few normal breaths between deep  breaths. 9. The spirometer may include an indicator to show your best effort. Use the indicator as a goal to work toward during each repetition. 10. After each set of 10 deep breaths, practice coughing to be sure your lungs are clear. If you have an incision (the cut made at the time of surgery), support your incision when coughing by placing a pillow or rolled up towels firmly against it. Once you are able to get out of bed, walk around indoors and cough well. You may stop using the incentive spirometer when instructed by your caregiver.  RISKS AND COMPLICATIONS  Take your time so you do not get dizzy or light-headed.  If you are in pain, you may need to take or ask for pain medication before doing incentive spirometry. It is harder to take a deep breath if you are having pain. AFTER USE  Rest and breathe slowly and easily.  It can be helpful to keep track of a log of your progress. Your caregiver can provide you with a simple table to help with this. If you are using the spirometer at home, follow these instructions: SEEK MEDICAL CARE IF:   You are having difficultly using the spirometer.  You have trouble using the spirometer as often as instructed.  Your pain medication is not giving enough relief while using the spirometer.  You develop fever of 100.5 F (38.1 C) or higher. SEEK IMMEDIATE MEDICAL CARE IF:   You cough up bloody sputum that had not been present before.  You develop fever of 102 F (38.9 C) or greater.  You develop worsening pain at or near the incision site. MAKE SURE YOU:   Understand these instructions.  Will watch your condition.  Will get help right away if you are not doing well or get worse. Document Released: 02/15/2007 Document Revised: 12/28/2011 Document Reviewed: 04/18/2007 [facility] Patient Information 2014 [place], [place].   ________________________________________________________________________

## 2021-03-03 ENCOUNTER — Other Ambulatory Visit: Payer: Self-pay

## 2021-03-03 ENCOUNTER — Encounter (HOSPITAL_COMMUNITY): Payer: Self-pay

## 2021-03-03 ENCOUNTER — Other Ambulatory Visit (HOSPITAL_COMMUNITY)
Admission: RE | Admit: 2021-03-03 | Discharge: 2021-03-03 | Disposition: A | Discharge status: Home / Self Care | Payer: Medicare PPO | Source: Ambulatory Visit | Attending: Orthopedic Surgery | Admitting: Orthopedic Surgery

## 2021-03-03 ENCOUNTER — Encounter (HOSPITAL_COMMUNITY)
Admission: RE | Admit: 2021-03-03 | Discharge: 2021-03-03 | Disposition: A | Discharge status: Home / Self Care | Payer: Medicare PPO | Source: Ambulatory Visit | Attending: Orthopedic Surgery | Admitting: Orthopedic Surgery

## 2021-03-03 DIAGNOSIS — Z01812 Encounter for preprocedural laboratory examination: Secondary | ICD-10-CM | POA: Insufficient documentation

## 2021-03-03 DIAGNOSIS — Z20822 Contact with and (suspected) exposure to covid-19: Secondary | ICD-10-CM | POA: Insufficient documentation

## 2021-03-03 HISTORY — DX: Atherosclerotic heart disease of native coronary artery without angina pectoris: I25.10

## 2021-03-03 LAB — URINALYSIS, ROUTINE W REFLEX MICROSCOPIC
Bilirubin Urine: NEGATIVE
Glucose, UA: NEGATIVE mg/dL
Hgb urine dipstick: NEGATIVE
Ketones, ur: NEGATIVE mg/dL
Leukocytes,Ua: NEGATIVE
Nitrite: NEGATIVE
Protein, ur: NEGATIVE mg/dL
Specific Gravity, Urine: 1.016 (ref 1.005–1.030)
pH: 5 (ref 5.0–8.0)

## 2021-03-03 LAB — COMPREHENSIVE METABOLIC PANEL
ALT: 187 U/L — ABNORMAL HIGH (ref 0–44)
AST: 143 U/L — ABNORMAL HIGH (ref 15–41)
Albumin: 4.3 g/dL (ref 3.5–5.0)
Alkaline Phosphatase: 59 U/L (ref 38–126)
Anion gap: 7 (ref 5–15)
BUN: 13 mg/dL (ref 6–20)
CO2: 25 mmol/L (ref 22–32)
Calcium: 9.3 mg/dL (ref 8.9–10.3)
Chloride: 102 mmol/L (ref 98–111)
Creatinine, Ser: 0.71 mg/dL (ref 0.61–1.24)
GFR, Estimated: >60 mL/min (ref >60)
Glucose, Bld: 122 mg/dL — ABNORMAL HIGH (ref 70–99)
Potassium: 3.9 mmol/L (ref 3.5–5.1)
Sodium: 134 mmol/L — ABNORMAL LOW (ref 135–145)
Total Bilirubin: 0.8 mg/dL (ref 0.3–1.2)
Total Protein: 7.6 g/dL (ref 6.5–8.1)

## 2021-03-03 LAB — PROTIME-INR
INR: 1.1 (ref 0.8–1.2)
Prothrombin Time: 14 seconds (ref 11.4–15.2)

## 2021-03-03 LAB — CBC
HCT: 42.4 % (ref 39.0–52.0)
Hemoglobin: 15 g/dL (ref 13.0–17.0)
MCH: 36.1 pg — ABNORMAL HIGH (ref 26.0–34.0)
MCHC: 35.4 g/dL (ref 30.0–36.0)
MCV: 101.9 fL — ABNORMAL HIGH (ref 80.0–100.0)
Platelets: 166 10*3/uL (ref 150–400)
RBC: 4.16 MIL/uL — ABNORMAL LOW (ref 4.22–5.81)
RDW: 14.7 % (ref 11.5–15.5)
WBC: 5.6 10*3/uL (ref 4.0–10.5)
nRBC: 0 % (ref 0.0–0.2)

## 2021-03-03 LAB — SURGICAL PCR SCREEN
MRSA, PCR: NEGATIVE
Staphylococcus aureus: NEGATIVE

## 2021-03-03 NOTE — Progress Notes (Signed)
COVID Vaccine Completed:No Date COVID Vaccine completed: COVID vaccine manufacturer: [employer]   PCP - Dr. Jani Files. [facility] in [place] Cardiologist - none  Chest x-ray - no EKG - 06/03/20-epic Stress Test - no ECHO - no Cardiac Cath -  Pacemaker/ICD device last checked:  Sleep Study - no CPAP -   Fasting Blood Sugar - NA Checks Blood Sugar _____ times a day  Blood Thinner Instructions:ASA/ Dr. Lennice Sites Aspirin Instructions:stop 5 days prior to DOS/[place] Last Dose:02/20/21  Anesthesia review:   Patient denies shortness of breath, fever, cough and chest pain at PAT appointment Pt reports no SOB with any activities. He uses a stationary bike.  Patient verbalized understanding of instructions that were given to them at the PAT appointment. Patient was also instructed that they will need to review over the PAT instructions again at home before surgery.Yes

## 2021-03-04 LAB — SARS CORONAVIRUS 2 (TAT 6-24 HRS): SARS Coronavirus 2: NEGATIVE

## 2021-03-04 MED ORDER — CEFAZOLIN IN SODIUM CHLORIDE 3-0.9 GM/100ML-% IV SOLN
3.0000 g | INTRAVENOUS | Status: AC
  Administered 2021-03-05: 3 g via INTRAVENOUS
  Filled 2021-03-04: qty 100

## 2021-03-05 ENCOUNTER — Ambulatory Visit (HOSPITAL_COMMUNITY): Payer: Medicare PPO

## 2021-03-05 ENCOUNTER — Ambulatory Visit (HOSPITAL_COMMUNITY)
Admission: RE | Admit: 2021-03-05 | Discharge: 2021-03-05 | Disposition: A | Discharge status: Home / Self Care | Payer: Medicare PPO | Source: Ambulatory Visit | Attending: Orthopedic Surgery | Admitting: Orthopedic Surgery

## 2021-03-05 ENCOUNTER — Encounter (HOSPITAL_COMMUNITY): Payer: Self-pay | Admitting: Orthopedic Surgery

## 2021-03-05 ENCOUNTER — Encounter (HOSPITAL_COMMUNITY)
Admission: RE | Disposition: A | Discharge status: Home / Self Care | Payer: Self-pay | Source: Ambulatory Visit | Attending: Orthopedic Surgery

## 2021-03-05 ENCOUNTER — Ambulatory Visit (HOSPITAL_COMMUNITY): Payer: Medicare PPO | Admitting: Physician Assistant

## 2021-03-05 ENCOUNTER — Ambulatory Visit (HOSPITAL_COMMUNITY): Payer: Medicare PPO | Admitting: Certified Registered Nurse Anesthetist

## 2021-03-05 DIAGNOSIS — M1711 Unilateral primary osteoarthritis, right knee: Secondary | ICD-10-CM | POA: Insufficient documentation

## 2021-03-05 DIAGNOSIS — Z96643 Presence of artificial hip joint, bilateral: Secondary | ICD-10-CM | POA: Diagnosis not present

## 2021-03-05 DIAGNOSIS — F17210 Nicotine dependence, cigarettes, uncomplicated: Secondary | ICD-10-CM | POA: Insufficient documentation

## 2021-03-05 DIAGNOSIS — Z79899 Other long term (current) drug therapy: Secondary | ICD-10-CM | POA: Diagnosis not present

## 2021-03-05 HISTORY — PX: KNEE ARTHROPLASTY: SHX992

## 2021-03-05 LAB — TYPE AND SCREEN
ABO/RH(D): A NEG
Antibody Screen: NEGATIVE

## 2021-03-05 SURGERY — ARTHROPLASTY, KNEE, TOTAL, USING IMAGELESS COMPUTER-ASSISTED NAVIGATION
Anesthesia: Regional | Site: Knee | Laterality: Right

## 2021-03-05 MED ORDER — KETOROLAC TROMETHAMINE 15 MG/ML IJ SOLN: 15.0000 mg | Freq: Four times a day (QID) | INTRAMUSCULAR | Status: DC

## 2021-03-05 MED ORDER — ROPIVACAINE HCL 5 MG/ML IJ SOLN
INTRAMUSCULAR | Status: DC | PRN
  Administered 2021-03-05: 20 mL via PERINEURAL

## 2021-03-05 MED ORDER — ORAL CARE MOUTH RINSE: 15.0000 mL | Freq: Once | OROMUCOSAL | Status: AC

## 2021-03-05 MED ORDER — ONDANSETRON HCL 4 MG/2ML IJ SOLN
INTRAMUSCULAR | Status: DC | PRN
  Administered 2021-03-05: 4 mg via INTRAVENOUS

## 2021-03-05 MED ORDER — TRANEXAMIC ACID-NACL 1000-0.7 MG/100ML-% IV SOLN: 1000.0000 mg | Freq: Once | INTRAVENOUS | Status: DC

## 2021-03-05 MED ORDER — OXYCODONE HCL 5 MG PO TABS: 5.0000 mg | ORAL_TABLET | ORAL | 0 refills | Status: AC | PRN

## 2021-03-05 MED ORDER — FENTANYL CITRATE (PF) 250 MCG/5ML IJ SOLN
INTRAMUSCULAR | Status: AC
  Filled 2021-03-05: qty 5

## 2021-03-05 MED ORDER — METHOCARBAMOL 500 MG PO TABS: 500.0000 mg | ORAL_TABLET | Freq: Four times a day (QID) | ORAL | Status: DC | PRN

## 2021-03-05 MED ORDER — PHENYLEPHRINE HCL (PRESSORS) 10 MG/ML IV SOLN
INTRAVENOUS | Status: AC
  Filled 2021-03-05: qty 1

## 2021-03-05 MED ORDER — FENTANYL CITRATE (PF) 100 MCG/2ML IJ SOLN: 25.0000 ug | INTRAMUSCULAR | Status: DC | PRN

## 2021-03-05 MED ORDER — OXYCODONE HCL 5 MG PO TABS: 5.0000 mg | ORAL_TABLET | ORAL | Status: DC | PRN

## 2021-03-05 MED ORDER — SODIUM CHLORIDE (PF) 0.9 % IJ SOLN
INTRAMUSCULAR | Status: AC
  Filled 2021-03-05: qty 10

## 2021-03-05 MED ORDER — OXYCODONE HCL 5 MG/5ML PO SOLN: 5.0000 mg | Freq: Once | ORAL | Status: AC | PRN

## 2021-03-05 MED ORDER — HYDROMORPHONE HCL 1 MG/ML IJ SOLN: 0.5000 mg | INTRAMUSCULAR | Status: DC | PRN

## 2021-03-05 MED ORDER — BUPIVACAINE-EPINEPHRINE (PF) 0.25% -1:200000 IJ SOLN
INTRAMUSCULAR | Status: AC
  Filled 2021-03-05: qty 30

## 2021-03-05 MED ORDER — PROPOFOL 1000 MG/100ML IV EMUL
INTRAVENOUS | Status: AC
  Filled 2021-03-05: qty 100

## 2021-03-05 MED ORDER — METOCLOPRAMIDE HCL 5 MG PO TABS
5.0000 mg | ORAL_TABLET | Freq: Three times a day (TID) | ORAL | Status: DC | PRN
  Filled 2021-03-05: qty 2

## 2021-03-05 MED ORDER — OXYCODONE HCL 5 MG PO TABS
ORAL_TABLET | ORAL | Status: AC
  Administered 2021-03-05: 5 mg via ORAL
  Filled 2021-03-05: qty 1

## 2021-03-05 MED ORDER — ONDANSETRON HCL 4 MG PO TABS: 4.0000 mg | ORAL_TABLET | Freq: Three times a day (TID) | ORAL | 0 refills | Status: AC | PRN

## 2021-03-05 MED ORDER — METHOCARBAMOL 500 MG IVPB - SIMPLE MED: 500.0000 mg | Freq: Four times a day (QID) | INTRAVENOUS | Status: DC | PRN

## 2021-03-05 MED ORDER — OXYCODONE HCL 5 MG PO TABS
5.0000 mg | ORAL_TABLET | Freq: Once | ORAL | Status: AC | PRN
Start: 2021-03-05 — End: 2021-03-05

## 2021-03-05 MED ORDER — DOCUSATE SODIUM 100 MG PO CAPS: 100.0000 mg | ORAL_CAPSULE | Freq: Two times a day (BID) | ORAL | 1 refills | Status: AC

## 2021-03-05 MED ORDER — SODIUM CHLORIDE 0.9 % IV SOLN: INTRAVENOUS | Status: DC

## 2021-03-05 MED ORDER — EPHEDRINE SULFATE-NACL 50-0.9 MG/10ML-% IV SOSY
PREFILLED_SYRINGE | INTRAVENOUS | Status: DC | PRN
  Administered 2021-03-05: 5 mg via INTRAVENOUS
  Administered 2021-03-05: 10 mg via INTRAVENOUS

## 2021-03-05 MED ORDER — KETOROLAC TROMETHAMINE 30 MG/ML IJ SOLN
INTRAMUSCULAR | Status: DC | PRN
  Administered 2021-03-05: 30 mg

## 2021-03-05 MED ORDER — ONDANSETRON HCL 4 MG PO TABS
4.0000 mg | ORAL_TABLET | Freq: Four times a day (QID) | ORAL | Status: DC | PRN
  Filled 2021-03-05: qty 1

## 2021-03-05 MED ORDER — TRANEXAMIC ACID-NACL 1000-0.7 MG/100ML-% IV SOLN
1000.0000 mg | INTRAVENOUS | Status: AC
  Administered 2021-03-05: 1000 mg via INTRAVENOUS
  Filled 2021-03-05: qty 100

## 2021-03-05 MED ORDER — POVIDONE-IODINE 10 % EX SWAB
2.0000 "application " | Freq: Once | CUTANEOUS | Status: AC
  Administered 2021-03-05: 2 via TOPICAL

## 2021-03-05 MED ORDER — CEFAZOLIN SODIUM-DEXTROSE 2-4 GM/100ML-% IV SOLN: 2.0000 g | Freq: Four times a day (QID) | INTRAVENOUS | Status: DC

## 2021-03-05 MED ORDER — ISOPROPYL ALCOHOL 70 % SOLN
Status: DC | PRN
  Administered 2021-03-05: 1 via TOPICAL

## 2021-03-05 MED ORDER — 0.9 % SODIUM CHLORIDE (POUR BTL) OPTIME
TOPICAL | Status: DC | PRN
  Administered 2021-03-05: 1000 mL

## 2021-03-05 MED ORDER — METOCLOPRAMIDE HCL 5 MG/ML IJ SOLN: 5.0000 mg | Freq: Three times a day (TID) | INTRAMUSCULAR | Status: DC | PRN

## 2021-03-05 MED ORDER — LACTATED RINGERS IV BOLUS
250.0000 mL | Freq: Once | INTRAVENOUS | Status: AC
  Administered 2021-03-05: 250 mL via INTRAVENOUS

## 2021-03-05 MED ORDER — DEXAMETHASONE SODIUM PHOSPHATE 10 MG/ML IJ SOLN
INTRAMUSCULAR | Status: DC | PRN
  Administered 2021-03-05: 10 mg via INTRAVENOUS

## 2021-03-05 MED ORDER — ONDANSETRON HCL 4 MG/2ML IJ SOLN: 4.0000 mg | Freq: Four times a day (QID) | INTRAMUSCULAR | Status: DC | PRN

## 2021-03-05 MED ORDER — SENNA 8.6 MG PO TABS: 2.0000 | ORAL_TABLET | Freq: Every day | ORAL | 1 refills | Status: AC

## 2021-03-05 MED ORDER — ASPIRIN 81 MG PO CHEW: 81.0000 mg | CHEWABLE_TABLET | Freq: Two times a day (BID) | ORAL | 0 refills | Status: AC

## 2021-03-05 MED ORDER — OXYCODONE HCL 5 MG PO TABS: 10.0000 mg | ORAL_TABLET | ORAL | Status: DC | PRN

## 2021-03-05 MED ORDER — ISOPROPYL ALCOHOL 70 % SOLN
Status: AC
  Filled 2021-03-05: qty 480

## 2021-03-05 MED ORDER — MIDAZOLAM HCL 5 MG/5ML IJ SOLN
INTRAMUSCULAR | Status: DC | PRN
  Administered 2021-03-05 (×2): 1 mg via INTRAVENOUS

## 2021-03-05 MED ORDER — ACETAMINOPHEN 325 MG PO TABS: 325.0000 mg | ORAL_TABLET | Freq: Four times a day (QID) | ORAL | Status: DC | PRN

## 2021-03-05 MED ORDER — LIDOCAINE 2% (20 MG/ML) 5 ML SYRINGE
INTRAMUSCULAR | Status: DC | PRN
  Administered 2021-03-05: 60 mg via INTRAVENOUS

## 2021-03-05 MED ORDER — CHLORHEXIDINE GLUCONATE 0.12 % MT SOLN
15.0000 mL | Freq: Once | OROMUCOSAL | Status: AC
  Administered 2021-03-05: 15 mL via OROMUCOSAL

## 2021-03-05 MED ORDER — FENTANYL CITRATE (PF) 100 MCG/2ML IJ SOLN
INTRAMUSCULAR | Status: AC
  Filled 2021-03-05: qty 2

## 2021-03-05 MED ORDER — KETOROLAC TROMETHAMINE 30 MG/ML IJ SOLN
INTRAMUSCULAR | Status: AC
  Filled 2021-03-05: qty 1

## 2021-03-05 MED ORDER — HYDROMORPHONE HCL 2 MG/ML IJ SOLN
INTRAMUSCULAR | Status: AC
  Filled 2021-03-05: qty 1

## 2021-03-05 MED ORDER — SODIUM CHLORIDE 0.9 % IR SOLN
Status: DC | PRN
  Administered 2021-03-05: 1000 mL

## 2021-03-05 MED ORDER — ACETAMINOPHEN 10 MG/ML IV SOLN
1000.0000 mg | Freq: Once | INTRAVENOUS | Status: AC
  Administered 2021-03-05: 1000 mg via INTRAVENOUS
  Filled 2021-03-05: qty 100

## 2021-03-05 MED ORDER — BUPIVACAINE-EPINEPHRINE 0.25% -1:200000 IJ SOLN
INTRAMUSCULAR | Status: DC | PRN
  Administered 2021-03-05: 30 mL

## 2021-03-05 MED ORDER — SODIUM CHLORIDE 0.9% FLUSH
INTRAVENOUS | Status: DC | PRN
  Administered 2021-03-05: 30 mL

## 2021-03-05 MED ORDER — PROPOFOL 10 MG/ML IV BOLUS
INTRAVENOUS | Status: AC
  Filled 2021-03-05: qty 20

## 2021-03-05 MED ORDER — HYDROMORPHONE HCL 1 MG/ML IJ SOLN
INTRAMUSCULAR | Status: DC | PRN
  Administered 2021-03-05: .2 mg via INTRAVENOUS
  Administered 2021-03-05: .4 mg via INTRAVENOUS
  Administered 2021-03-05 (×2): .2 mg via INTRAVENOUS
  Administered 2021-03-05: .4 mg via INTRAVENOUS
  Administered 2021-03-05: .6 mg via INTRAVENOUS

## 2021-03-05 MED ORDER — LACTATED RINGERS IV SOLN: INTRAVENOUS | Status: DC

## 2021-03-05 MED ORDER — MIDAZOLAM HCL 2 MG/2ML IJ SOLN
INTRAMUSCULAR | Status: AC
  Filled 2021-03-05: qty 2

## 2021-03-05 MED ORDER — SODIUM CHLORIDE (PF) 0.9 % IJ SOLN
INTRAMUSCULAR | Status: AC
  Filled 2021-03-05: qty 30

## 2021-03-05 MED ORDER — POVIDONE-IODINE 10 % EX SWAB: 2.0000 "application " | Freq: Once | CUTANEOUS | Status: DC

## 2021-03-05 MED ORDER — SODIUM CHLORIDE (PF) 0.9 % IJ SOLN
INTRAMUSCULAR | Status: DC | PRN
  Administered 2021-03-05: 50 mL

## 2021-03-05 MED ORDER — EPHEDRINE 5 MG/ML INJ
INTRAVENOUS | Status: AC
  Filled 2021-03-05: qty 10

## 2021-03-05 MED ORDER — ONDANSETRON HCL 4 MG/2ML IJ SOLN
INTRAMUSCULAR | Status: AC
  Filled 2021-03-05: qty 2

## 2021-03-05 MED ORDER — LACTATED RINGERS IV BOLUS
500.0000 mL | Freq: Once | INTRAVENOUS | Status: AC
  Administered 2021-03-05: 500 mL via INTRAVENOUS

## 2021-03-05 MED ORDER — METHOCARBAMOL 500 MG IVPB - SIMPLE MED
INTRAVENOUS | Status: AC
  Administered 2021-03-05: 500 mg via INTRAVENOUS
  Filled 2021-03-05: qty 50

## 2021-03-05 MED ORDER — MELOXICAM 15 MG PO TABS: 15.0000 mg | ORAL_TABLET | Freq: Every day | ORAL | 3 refills | Status: AC

## 2021-03-05 MED ORDER — PROPOFOL 10 MG/ML IV BOLUS
INTRAVENOUS | Status: DC | PRN
  Administered 2021-03-05: 250 mg via INTRAVENOUS

## 2021-03-05 MED ORDER — STERILE WATER FOR IRRIGATION IR SOLN
Status: DC | PRN
  Administered 2021-03-05: 2000 mL

## 2021-03-05 MED ORDER — DEXAMETHASONE SODIUM PHOSPHATE 10 MG/ML IJ SOLN
INTRAMUSCULAR | Status: AC
  Filled 2021-03-05: qty 1

## 2021-03-05 MED ORDER — FENTANYL CITRATE (PF) 100 MCG/2ML IJ SOLN
INTRAMUSCULAR | Status: DC | PRN
  Administered 2021-03-05: 50 ug via INTRAVENOUS
  Administered 2021-03-05: 100 ug via INTRAVENOUS
  Administered 2021-03-05: 50 ug via INTRAVENOUS
  Administered 2021-03-05: 100 ug via INTRAVENOUS
  Administered 2021-03-05: 50 ug via INTRAVENOUS

## 2021-03-05 SURGICAL SUPPLY — 75 items
ATTUNE MED DOME PAT 41 KNEE (Knees) ×2 IMPLANT
BAG ZIPLOCK 12X15 (MISCELLANEOUS) IMPLANT
BASEPLATE TIB CMT FB PCKT SZ 9 (Knees) ×2 IMPLANT
BATTERY INSTRU NAVIGATION (MISCELLANEOUS) ×6 IMPLANT
BLADE SAW RECIPROCATING 77.5 (BLADE) ×2 IMPLANT
BNDG ELASTIC 4X5.8 VLCR STR LF (GAUZE/BANDAGES/DRESSINGS) ×2 IMPLANT
BNDG ELASTIC 6X5.8 VLCR STR LF (GAUZE/BANDAGES/DRESSINGS) ×2 IMPLANT
BONE CEMENT GENTAMICIN (Cement) ×4 IMPLANT
CEMENT BONE GENTAMICIN 40 (Cement) ×2 IMPLANT
CHLORAPREP W/TINT 26 (MISCELLANEOUS) ×4 IMPLANT
COMP FEM CMTLS ATTUNE 9 RT (Joint) ×2 IMPLANT
COMPONENT FEM CMTLS ATTUN 9 RT (Joint) ×1 IMPLANT
COVER SURGICAL LIGHT HANDLE (MISCELLANEOUS) ×2 IMPLANT
COVER WAND RF STERILE (DRAPES) IMPLANT
CUFF TOURN SGL QUICK 34 (TOURNIQUET CUFF) ×1
CUFF TRNQT CYL 34X4.125X (TOURNIQUET CUFF) ×1 IMPLANT
DECANTER SPIKE VIAL GLASS SM (MISCELLANEOUS) ×4 IMPLANT
DERMABOND ADVANCED (GAUZE/BANDAGES/DRESSINGS) ×1
DERMABOND ADVANCED .7 DNX12 (GAUZE/BANDAGES/DRESSINGS) ×1 IMPLANT
DRAPE SHEET LG 3/4 BI-LAMINATE (DRAPES) ×6 IMPLANT
DRAPE U-SHAPE 47X51 STRL (DRAPES) ×2 IMPLANT
DRSG AQUACEL AG ADV 3.5X10 (GAUZE/BANDAGES/DRESSINGS) IMPLANT
DRSG AQUACEL AG ADV 3.5X14 (GAUZE/BANDAGES/DRESSINGS) ×2 IMPLANT
DRSG TEGADERM 4X4.75 (GAUZE/BANDAGES/DRESSINGS) IMPLANT
ELECT BLADE TIP CTD 4 INCH (ELECTRODE) ×4 IMPLANT
ELECT REM PT RETURN 15FT ADLT (MISCELLANEOUS) ×2 IMPLANT
EVACUATOR 1/8 PVC DRAIN (DRAIN) IMPLANT
GAUZE SPONGE 4X4 12PLY STRL (GAUZE/BANDAGES/DRESSINGS) ×2 IMPLANT
GLOVE SRG 8 PF TXTR STRL LF DI (GLOVE) ×2 IMPLANT
GLOVE SURG ENC MOIS LTX SZ8.5 (GLOVE) ×4 IMPLANT
GLOVE SURG ENC TEXT LTX SZ7.5 (GLOVE) ×6 IMPLANT
GLOVE SURG UNDER POLY LF SZ8 (GLOVE) ×2
GLOVE SURG UNDER POLY LF SZ8.5 (GLOVE) ×2 IMPLANT
GOWN SPEC L3 XXLG W/TWL (GOWN DISPOSABLE) ×2 IMPLANT
GOWN SPEC L4 XLG W/TWL (GOWN DISPOSABLE) ×2 IMPLANT
HANDPIECE INTERPULSE COAX TIP (DISPOSABLE) ×1
HOLDER FOLEY CATH W/STRAP (MISCELLANEOUS) ×2 IMPLANT
HOOD PEEL AWAY FLYTE STAYCOOL (MISCELLANEOUS) ×6 IMPLANT
INSERT TIB BEAR ATTUNE CR 9X7 (Insert) ×2 IMPLANT
JET LAVAGE IRRISEPT WOUND (IRRIGATION / IRRIGATOR) ×2
KIT TURNOVER KIT A (KITS) ×2 IMPLANT
LAVAGE JET IRRISEPT WOUND (IRRIGATION / IRRIGATOR) ×1 IMPLANT
MARKER SKIN DUAL TIP RULER LAB (MISCELLANEOUS) ×2 IMPLANT
NDL SAFETY ECLIPSE 18X1.5 (NEEDLE) ×1 IMPLANT
NEEDLE HYPO 18GX1.5 SHARP (NEEDLE) ×1
NEEDLE SPNL 18GX3.5 QUINCKE PK (NEEDLE) ×2 IMPLANT
NS IRRIG 1000ML POUR BTL (IV SOLUTION) ×2 IMPLANT
PACK TOTAL KNEE CUSTOM (KITS) ×2 IMPLANT
PADDING CAST COTTON 6X4 STRL (CAST SUPPLIES) ×2 IMPLANT
PENCIL SMOKE EVACUATOR (MISCELLANEOUS) IMPLANT
PIN FIX SIGMA LCS THRD HI (PIN) ×2 IMPLANT
PIN STEINMAN FIXATION KNEE (PIN) ×2 IMPLANT
PROTECTOR NERVE ULNAR (MISCELLANEOUS) IMPLANT
SAW OSC TIP CART 19.5X105X1.3 (SAW) ×4 IMPLANT
SEALER BIPOLAR AQUA 6.0 (INSTRUMENTS) ×2 IMPLANT
SET HNDPC FAN SPRY TIP SCT (DISPOSABLE) ×1 IMPLANT
SET PAD KNEE POSITIONER (MISCELLANEOUS) ×2 IMPLANT
SPONGE DRAIN TRACH 4X4 STRL 2S (GAUZE/BANDAGES/DRESSINGS) IMPLANT
STAPLER INSORB 30 2030 C-SECTI (MISCELLANEOUS) ×2 IMPLANT
SUT MNCRL AB 3-0 PS2 18 (SUTURE) ×2 IMPLANT
SUT MNCRL AB 4-0 PS2 18 (SUTURE) ×2 IMPLANT
SUT MON AB 2-0 CT1 36 (SUTURE) ×2 IMPLANT
SUT STRATAFIX PDO 1 14 VIOLET (SUTURE) ×1
SUT STRATFX PDO 1 14 VIOLET (SUTURE) ×1
SUT VIC AB 1 CTX 36 (SUTURE) ×2
SUT VIC AB 1 CTX36XBRD ANBCTR (SUTURE) ×2 IMPLANT
SUT VIC AB 2-0 CT1 27 (SUTURE) ×1
SUT VIC AB 2-0 CT1 TAPERPNT 27 (SUTURE) ×1 IMPLANT
SUTURE STRATFX PDO 1 14 VIOLET (SUTURE) ×1 IMPLANT
SYR 3ML LL SCALE MARK (SYRINGE) ×2 IMPLANT
TOWER CARTRIDGE SMART MIX (DISPOSABLE) ×2 IMPLANT
TRAY FOLEY MTR SLVR 16FR STAT (SET/KITS/TRAYS/PACK) IMPLANT
TUBE SUCTION HIGH CAP CLEAR NV (SUCTIONS) ×2 IMPLANT
WATER STERILE IRR 1000ML POUR (IV SOLUTION) ×4 IMPLANT
WRAP KNEE MAXI GEL POST OP (GAUZE/BANDAGES/DRESSINGS) ×2 IMPLANT

## 2021-03-05 NOTE — Op Note (Signed)
OPERATIVE REPORT  SURGEON: Samson Frederic, MD   ASSISTANT: Barrie Dunker, PA-C  PREOPERATIVE DIAGNOSIS: Right knee arthritis.   POSTOPERATIVE DIAGNOSIS: Right knee arthritis.   PROCEDURE: Right total knee arthroplasty.   IMPLANTS: DePuy Attune CR cementless femur, size 9. DePuy 9 cemented tibia, size 9. Aox polyethelyene insert, size 7 mm, CR. 3 button asymmetric patella, size 41 mm. GHV bone cement.  ANESTHESIA:  GETA  TOURNIQUET TIME: Not utilized.  ESTIMATED BLOOD LOSS: -400 mL  ANTIBIOTICS: 3g Ancef.  DRAINS: Medium HV x1.  COMPLICATIONS: None   CONDITION: PACU - hemodynamically stable.   BRIEF CLINICAL NOTE: Phillip Thomas is a 60 y.o. male with a long-standing history of Right knee arthritis. After failing conservative management, the patient was indicated for total knee arthroplasty. The risks, benefits, and alternatives to the procedure were explained, and the patient elected to proceed.  PROCEDURE IN DETAIL: Adductor canal block was obtained in the pre-op holding area. Once inside the operative room, general anesthesia was obtained, and a foley catheter was inserted. The patient was then positioned and the lower extremity was prepped and draped in the normal sterile surgical fashion. A time-out was called verifying side and site of surgery. The patient received IV antibiotics within 60 minutes of beginning the procedure.   An anterior approach to the knee was performed utilizing a midvastus arthrotomy. A medial release was performed and the patellar fat pad was excised. Stryker imageless navigation was used to cut the distal femur perpendicular to the mechanical axis. A freehand patellar resection was performed, and the patella was sized an prepared with 3 lug holes.  Nagivation was used to make a neutral proximal tibia resection, taking 4 mm of bone from the less  affected lateral side with 4 degrees of slope. The menisci were excised. A spacer block was placed, and the alignment and balance in extension were confirmed.   The distal femur was sized using the 3-degree external rotation guide referencing the posterior femoral cortex. The appropriate 4-in-1 cutting block was pinned into place. Rotation was checked using Whiteside's line, the epicondylar axis, and then confirmed with a spacer block in flexion. The remaining femoral cuts were performed, taking care to protect the MCL.  The tibia was sized and the trial tray was pinned into place. The remaining trail components were inserted. The knee was stable to varus and valgus stress through a full range of motion. The patella tracked centrally, and the PCL was well balanced. The trial components were removed, and the proximal tibial surface was prepared. Small drill holes were made in the sclerotic subchondral bone.The cut bony surfaces were irrigated with pulse lavage. Final tibial and patellar components were cemented into place, and femoral component was impacted into place. Excess cement was cleared. The tibial insert was placed, and the knee was brought into extension while the cement polymerized. Once the cement was hard, the knee was tested for a final time and found to be well balanced.  The wound was copiously irrigated with Irrisept solution followed by normal saline with pulse lavage. Marcaine solution was injected into the periarticular soft tissue. The wound was closed in layers using #1 Vicryl and Stratafix for the fascia, 2-0 Vicryl for the subcutaneous fat, 2-0 Monocryl for the deep dermal layer, 3-0 running Monocryl subcuticular Stitch, and Dermabond for the skin. Once the glue was fully dried, an Aquacell Ag and compressive dressing were applied. The tourniquet was let down, and the patient was transported to the recovery room in  stable ondition. Sponge, needle, and instrument counts were correct  at the end of the case x2. The patient tolerated the procedure well and there were no known complications.  Please note that a surgical assistant was a medical necessity for this procedure in order to perform it in a safe and expeditious manner. Surgical assistant was necessary to retract the ligaments and vital neurovascular structures to prevent injury to them and also necessary for proper positioning of the limb to allow for anatomic placement of the prosthesis.

## 2021-03-05 NOTE — Interval H&P Note (Signed)
History and Physical Interval Note:  03/05/2021 8:22 AM  Phillip Thomas  has presented today for surgery, with the diagnosis of Right knee degenerative joint disease.  The various methods of treatment have been discussed with the patient and family. After consideration of risks, benefits and other options for treatment, the patient has consented to  Procedure(s): COMPUTER ASSISTED TOTAL KNEE ARTHROPLASTY (Right) as a surgical intervention.  The patient's history has been reviewed, patient examined, no change in status, stable for surgery.  I have reviewed the patient's chart and labs.  Questions were answered to the patient's satisfaction.     [name]

## 2021-03-05 NOTE — Discharge Instructions (Signed)
 Dr. Madesyn Ast Total Joint Specialist [facility] [address] [place], [place] [address] [phone]  TOTAL KNEE REPLACEMENT POSTOPERATIVE DIRECTIONS    Knee Rehabilitation, Guidelines Following Surgery  Results after knee surgery are often greatly improved when you follow the exercise, range of motion and muscle strengthening exercises prescribed by your doctor. Safety measures are also important to protect the knee from further injury. Any time any of these exercises cause you to have increased pain or swelling in your knee joint, decrease the amount until you are comfortable again and slowly increase them. If you have problems or questions, call your caregiver or physical therapist for advice.   WEIGHT BEARING Weight bearing as tolerated with assist device (walker, cane, etc) as directed, use it as long as suggested by your surgeon or therapist, typically at least 4-6 weeks.  HOME CARE INSTRUCTIONS  . Remove items at home which could result in a fall. This includes throw rugs or furniture in walking pathways.  . Continue medications as instructed at time of discharge. . You may have some home medications which will be placed on hold until you complete the course of blood thinner medication.  . You may start showering once you are discharged home but do not submerge the incision under water. Just pat the incision dry and apply a dry gauze dressing on daily. . Walk with walker as instructed.  . You may resume a sexual relationship in one month or when given the OK by your doctor.   Use walker as long as suggested by your caregivers.  Avoid periods of inactivity such as sitting longer than an hour when not asleep. This helps prevent blood clots.  . You may put full weight on your legs and walk as much as is comfortable.  . You may return to work once you are cleared by your doctor.  . Do not drive a car for 6 weeks or until released by you surgeon.    Do not drive while taking narcotics.  . Wear the elastic stockings for three weeks following surgery during the day but you may remove then at night. . Make sure you keep all of your appointments after your operation with all of your doctors and caregivers. You should call the office at the above phone number and make an appointment for approximately two weeks after the date of your surgery. . Do not remove your surgical dressing. The dressing is waterproof; you may take showers in 3 days, but do not take tub baths or submerge the dressing. . Please pick up a stool softener and laxative for home use as long as you are requiring pain medications.  ICE to the affected knee every three hours for 30 minutes at a time and then as needed for pain and swelling.  Continue to use ice on the knee for pain and swelling from surgery. You may notice swelling that will progress down to the foot and ankle.  This is normal after surgery.  Elevate the leg when you are not up walking on it.   . It is important for you to complete the blood thinner medication as prescribed by your doctor.  Continue to use the breathing machine which will help keep your temperature down.  It is common for your temperature to cycle up and down following surgery, especially at night when you are not up moving around and exerting yourself.  The breathing machine keeps your lungs expanded and your temperature down.    RANGE OF MOTION AND STRENGTHENING EXERCISES  Rehabilitation of the knee is important following a knee injury or an operation. After just a few days of immobilization, the muscles of the thigh which control the knee become weakened and shrink (atrophy). Knee exercises are designed to build up the tone and strength of the thigh muscles and to improve knee motion. Often times heat used for twenty to thirty minutes before working out will loosen up your tissues and help with improving the range of motion but do not use heat for the  first two weeks following surgery. These exercises can be done on a training (exercise) mat, on the floor, on a table or on a bed. Use what ever works the best and is most comfortable for you Knee exercises include:  . Leg Lifts - While your knee is still immobilized in a splint or cast, you can do straight leg raises. Lift the leg to 60 degrees, hold for 3 sec, and slowly lower the leg. Repeat 10-20 times 2-3 times daily. Perform this exercise against resistance later as your knee gets better.  . Quad and Hamstring Sets - Tighten up the muscle on the front of the thigh (Quad) and hold for 5-10 sec. Repeat this 10-20 times hourly. Hamstring sets are done by pushing the foot backward against an object and holding for 5-10 sec. Repeat as with quad sets.  A rehabilitation program following serious knee injuries can speed recovery and prevent re-injury in the future due to weakened muscles. Contact your doctor or a physical therapist for more information on knee rehabilitation.   POST-OPERATIVE OPIOID TAPER INSTRUCTIONS: . It is important to wean off of your opioid medication as soon as possible. If you do not need pain medication after your surgery it is ok to stop day one. . Opioids include: o Codeine, Hydrocodone(Norco, Vicodin), Oxycodone(Percocet, oxycontin) and hydromorphone amongst others.  . Long term and even short term use of opiods can cause: o Increased pain response o Dependence o Constipation o Depression o Respiratory depression o And more.  . Withdrawal symptoms can include o Flu like symptoms o Nausea, vomiting o And more . Techniques to manage these symptoms o Hydrate well o Eat regular healthy meals o Stay active o Use relaxation techniques(deep breathing, meditating, yoga) . Do Not substitute Alcohol to help with tapering . If you have been on opioids for less than two weeks and do not have pain than it is ok to stop all together.  . Plan to wean off of opioids o This plan  should start within one week post op of your joint replacement. o Maintain the same interval or time between taking each dose and first decrease the dose.  o Cut the total daily intake of opioids by one tablet each day o Next start to increase the time between doses. o The last dose that should be eliminated is the evening dose.      SKILLED REHAB INSTRUCTIONS: If the patient is transferred to a skilled rehab facility following release from the hospital, a list of the current medications will be sent to the facility for the patient to continue.  When discharged from the skilled rehab facility, please have the facility set up the patient's Home Health Physical Therapy prior to being released. Also, the skilled facility will be responsible for providing the patient with their medications at time of release from the facility to include their pain medication, the muscle relaxants, and their blood thinner medication. If the   patient is still at the rehab facility at time of the two week follow up appointment, the skilled rehab facility will also need to assist the patient in arranging follow up appointment in our office and any transportation needs.  MAKE SURE YOU:  . Understand these instructions.  . Will watch your condition.  . Will get help right away if you are not doing well or get worse.    Pick up stool softner and laxative for home use following surgery while on pain medications. Do NOT remove your dressing. You may shower.  Do not take tub baths or submerge incision under water. May shower starting three days after surgery. Please use a clean towel to pat the incision dry following showers. Continue to use ice for pain and swelling after surgery. Do not use any lotions or creams on the incision until instructed by your surgeon.  

## 2021-03-05 NOTE — Transfer of Care (Signed)
Immediate Anesthesia Transfer of Care Note  Patient: Phillip Thomas  Procedure(s) Performed: COMPUTER ASSISTED TOTAL KNEE ARTHROPLASTY (Right Knee)  Patient Location: PACU  Anesthesia Type:General  Level of Consciousness: awake, alert  and oriented  Airway & Oxygen Therapy: Patient Spontanous Breathing and Patient connected to face mask oxygen  Post-op Assessment: Report given to RN and Post -op Vital signs reviewed and stable  Post vital signs: Reviewed and stable  Last Vitals:  Vitals Value Taken Time  BP    Temp    Pulse 78 03/05/21 1112  Resp 10 03/05/21 1112  SpO2 95 % 03/05/21 1112  Vitals shown include unvalidated device data.  Last Pain:  Vitals:   03/05/21 0649  TempSrc: Oral  PainSc:          Complications: No complications documented.

## 2021-03-05 NOTE — Anesthesia Preprocedure Evaluation (Addendum)
Anesthesia Evaluation  Patient identified by MRN, date of birth, ID band Patient awake    Reviewed: Allergy & Precautions, H&P , NPO status , Patient's Chart, lab work & pertinent test results  Airway Mallampati: II   Neck ROM: full    Dental   Pulmonary former smoker,    breath sounds clear to auscultation       Cardiovascular hypertension, + Peripheral Vascular Disease   Rhythm:regular Rate:Normal  S/p AAA repair (2021)   Neuro/Psych PSYCHIATRIC DISORDERS Anxiety Depression    GI/Hepatic   Endo/Other    Renal/GU      Musculoskeletal  (+) Arthritis ,   Abdominal   Peds  Hematology   Anesthesia Other Findings   Reproductive/Obstetrics                             Anesthesia Physical Anesthesia Plan  ASA: III  Anesthesia Plan: Regional and General   Post-op Pain Management:  Regional for Post-op pain   Induction: Intravenous  PONV Risk Score and Plan: 1 and Ondansetron, Dexamethasone, Midazolam and Treatment may vary due to age or medical condition  Airway Management Planned: Simple Face Mask  Additional Equipment:   Intra-op Plan:   Post-operative Plan:   Informed Consent: I have reviewed the patients History and Physical, chart, labs and discussed the procedure including the risks, benefits and alternatives for the proposed anesthesia with the patient or authorized representative who has indicated his/her understanding and acceptance.     Dental advisory given  Plan Discussed with: CRNA, Anesthesiologist and Surgeon  Anesthesia Plan Comments: (Pt declined spinal anesthesia.)       Anesthesia Quick Evaluation

## 2021-03-05 NOTE — Anesthesia Postprocedure Evaluation (Signed)
Anesthesia Post Note  Patient: Phillip Thomas  Procedure(s) Performed: COMPUTER ASSISTED TOTAL KNEE ARTHROPLASTY (Right Knee)     Patient location during evaluation: PACU Anesthesia Type: Regional and General Level of consciousness: awake and alert Pain management: pain level controlled Vital Signs Assessment: post-procedure vital signs reviewed and stable Respiratory status: spontaneous breathing, nonlabored ventilation, respiratory function stable and patient connected to nasal cannula oxygen Cardiovascular status: blood pressure returned to baseline and stable Postop Assessment: no apparent nausea or vomiting Anesthetic complications: no   No complications documented.  Last Vitals:  Vitals:   03/05/21 1300 03/05/21 1400  BP: 127/77 122/76  Pulse: 61 (!) 54  Resp: 15 12  Temp:    SpO2: 93% 92%    Last Pain:  Vitals:   03/05/21 1400  TempSrc:   PainSc: 2                  Kemi Gell S

## 2021-03-05 NOTE — Evaluation (Signed)
Physical Therapy Evaluation Patient Details Name: Phillip Thomas MRN: [id number] DOB: 1961/04/11 Today's Date: 03/05/2021   History of Present Illness  60 yo male s/p R TKA 03/05/21. Hx of R THA, L THA  Clinical Impression  On eval POD 0, pt was Supv for mobility. He walked ~200 feet with a RW and safely climbed up and over portable stairs. Moderate pain with activity. Pt tolerated activity well. Issued HEP for pt to perform 2x/day until OP PT begins. All education completed. Okay to d/c from PT standpoint.     Follow Up Recommendations Follow surgeon's recommendation for DC plan and follow-up therapies    Equipment Recommendations  None recommended by PT    Recommendations for Other Services       Precautions / Restrictions Precautions Precautions: Knee Restrictions Weight Bearing Restrictions: No Other Position/Activity Restrictions: WBAT      Mobility  Bed Mobility Overal bed mobility: Needs Assistance Bed Mobility: Supine to Sit     Supine to sit: Supervision          Transfers Overall transfer level: Needs assistance Equipment used: Rolling walker (2 wheeled) Transfers: Sit to/from Stand Sit to Stand: Supervision            Ambulation/Gait Supervision Gait Distance (Feet): 200 Feet Assistive device: Rolling walker (2 wheeled) Gait Pattern/deviations: Step-to pattern;Step-through pattern;Decreased stride length     General Gait Details: cues for safety, sequence, RW proximity.  Stairs Stairs: Yes Stairs assistance: Supervision Stair Management: Step to pattern Number of Stairs: 2 General stair comments: cues for safety, technique, sequence. supv for safety  Wheelchair Mobility    Modified Rankin (Stroke Patients Only)       Balance Overall balance assessment: Mild deficits observed, not formally tested                                           Pertinent Vitals/Pain Pain Assessment: 0-10 Pain Score: 5  Pain Location: R  knee Pain Descriptors / Indicators: Discomfort;Sore Pain Intervention(s): Monitored during session    Home Living Family/patient expects to be discharged to:: Private residence Living Arrangements: Spouse/significant other Available Help at Discharge: Family Type of Home: House Home Access: Stairs to enter Entrance Stairs-Rails: Can reach both;Left;Right Entrance Stairs-Number of Steps: 2 Home Layout: One level Home Equipment: Walker - 2 wheels;Cane - single point;Bedside commode      Prior Function Level of Independence: Independent               Hand Dominance        Extremity/Trunk Assessment   Upper Extremity Assessment Upper Extremity Assessment: Overall WFL for tasks assessed    Lower Extremity Assessment Lower Extremity Assessment: Generalized weakness    Cervical / Trunk Assessment Cervical / Trunk Assessment: Normal  Communication   Communication: No difficulties  Cognition Arousal/Alertness: Awake/alert Behavior During Therapy: WFL for tasks assessed/performed Overall Cognitive Status: Within Functional Limits for tasks assessed                                        General Comments      Exercises Total Joint Exercises Ankle Circles/Pumps: AROM;Both;10 reps Quad Sets: AROM;Both;10 reps Hip ABduction/ADduction: AROM;Left;10 reps Straight Leg Raises: AROM;Left;10 reps Goniometric ROM: ~10-65 degrees (seated)   Assessment/Plan    PT Assessment  Patient needs continued PT services  PT Problem List Decreased mobility;Decreased range of motion;Decreased balance;Decreased knowledge of use of DME;Pain       PT Treatment Interventions      PT Goals (Current goals can be found in the Care Plan section)  Acute Rehab PT Goals Patient Stated Goal: regain PLOF/independence PT Goal Formulation: All assessment and education complete, DC therapy    Frequency     Barriers to discharge        Co-evaluation                AM-PAC PT "6 Clicks" Mobility  Outcome Measure Help needed turning from your back to your side while in a flat bed without using bedrails?: A Little Help needed moving from lying on your back to sitting on the side of a flat bed without using bedrails?: A Little Help needed moving to and from a bed to a chair (including a wheelchair)?: A Little Help needed standing up from a chair using your arms (e.g., wheelchair or bedside chair)?: A Little Help needed to walk in hospital room?: A Little Help needed climbing 3-5 steps with a railing? : A Little 6 Click Score: 18    End of Session Equipment Utilized During Treatment: Gait belt Activity Tolerance: Patient tolerated treatment well Patient left: in chair;with call bell/phone within reach;with family/visitor present   PT Visit Diagnosis: Other abnormalities of gait and mobility (R26.89);Pain Pain - Right/Left: Right Pain - part of body: Knee    Time: 4496-7591 PT Time Calculation (min) (ACUTE ONLY): 30 min   Charges:   PT Evaluation $PT Eval Low Complexity: 1 Low PT Treatments $Gait Training: 8-22 mins          Faye Ramsay, PT Acute Rehabilitation  Office: [phone] Pager: [phone]

## 2021-03-05 NOTE — Anesthesia Procedure Notes (Signed)
Procedure Name: LMA Insertion Date/Time: 03/05/2021 8:36 AM Performed by: Orest Dikes, CRNA Pre-anesthesia Checklist: Patient identified, Emergency Drugs available, Suction available and Patient being monitored Patient Re-evaluated:Patient Re-evaluated prior to induction Oxygen Delivery Method: Circle system utilized Preoxygenation: Pre-oxygenation with 100% oxygen Induction Type: IV induction LMA: LMA inserted LMA Size: 5.0 Number of attempts: 1 Placement Confirmation: positive ETCO2 and breath sounds checked- equal and bilateral Tube secured with: Tape Dental Injury: Teeth and Oropharynx as per pre-operative assessment

## 2021-03-05 NOTE — Anesthesia Procedure Notes (Signed)
Anesthesia Regional Block: Adductor canal block   Pre-Anesthetic Checklist: ,, timeout performed, Correct Patient, Correct Site, Correct Laterality, Correct Procedure, Correct Position, site marked, Risks and benefits discussed,  Surgical consent,  Pre-op evaluation,  At surgeon's request and post-op pain management  Laterality: Right  Prep: chloraprep       Needles:  Injection technique: Single-shot  Needle Type: Echogenic Needle     Needle Length: 9cm  Needle Gauge: 21     Additional Needles:   Narrative:  Start time: 03/05/2021 7:51 AM End time: 03/05/2021 8:01 AM Injection made incrementally with aspirations every 5 mL.  Performed by: Personally  Anesthesiologist: Achille Rich, MD  Additional Notes: Pt tolerated the procedure well.

## 2021-03-13 ENCOUNTER — Encounter (HOSPITAL_COMMUNITY): Payer: Self-pay | Admitting: Orthopedic Surgery

## 2022-04-29 IMAGING — DX DG KNEE 1-2V PORT*R*
2 series · 2 of 2 positions shown · non-contrast
Comparison: None.

CLINICAL DATA: Status post right total knee arthroplasty.

EXAM:
PORTABLE RIGHT KNEE - 1-2 VIEW

[knee ap]
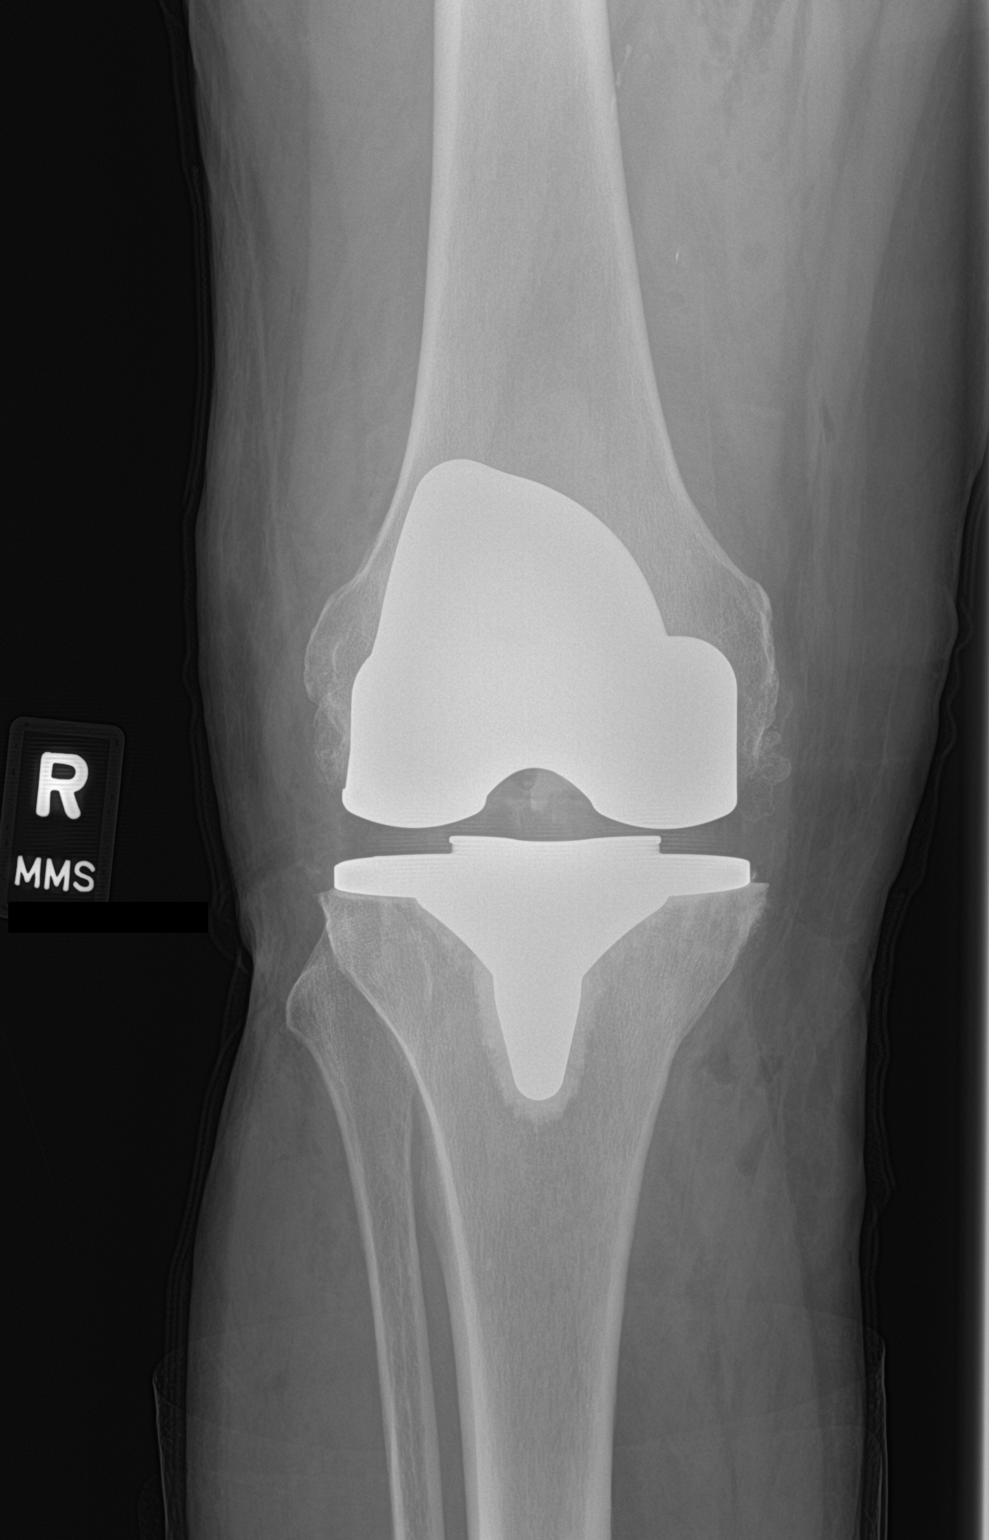

[knee lat]
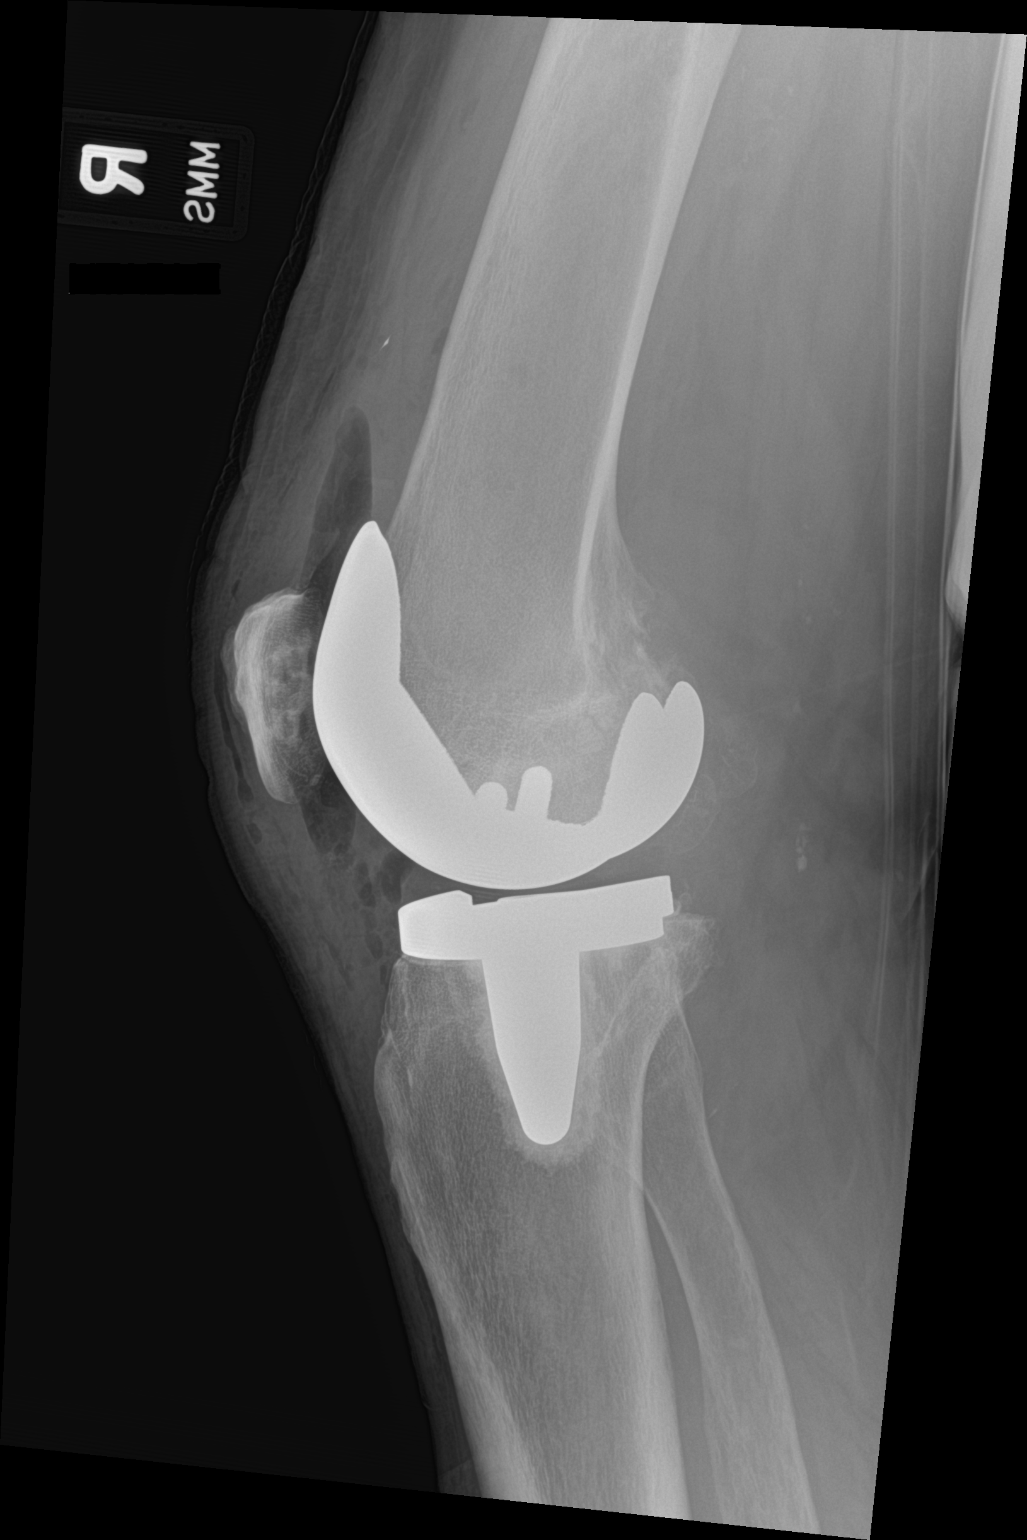

[2 of 2 positions shown; findings below may reference images not displayed]

FINDINGS: The right femoral and tibial components appear to be well situated.
Expected postoperative changes are seen in the soft tissues
anteriorly.
IMPRESSION: Status post right total knee arthroplasty.
